# Patient Record
Sex: Female | Born: 1985
Health system: Southern US, Community
[De-identification: ages and names within clinical notes are randomized; demographics above are authoritative.]

## PROBLEM LIST (undated history)

## (undated) DIAGNOSIS — J45909 Unspecified asthma, uncomplicated: Secondary | ICD-10-CM

## (undated) HISTORY — PX: FOOT SURGERY: SHX648

## (undated) HISTORY — PX: WISDOM TOOTH EXTRACTION: SHX21

## (undated) HISTORY — DX: Unspecified asthma, uncomplicated: J45.909

---
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
## 2007-03-05 ENCOUNTER — Emergency Department (HOSPITAL_COMMUNITY): Admission: EM | Admit: 2007-03-05 | Discharge: 2007-03-06 | Payer: Self-pay | Admitting: Emergency Medicine

## 2007-03-31 ENCOUNTER — Emergency Department (HOSPITAL_COMMUNITY): Admission: EM | Admit: 2007-03-31 | Discharge: 2007-03-31 | Payer: Self-pay | Admitting: Emergency Medicine

## 2013-08-14 ENCOUNTER — Emergency Department (HOSPITAL_COMMUNITY)
Admission: EM | Admit: 2013-08-14 | Discharge: 2013-08-14 | Disposition: A | Discharge status: Home / Self Care | Payer: 59 | Source: Home / Self Care | Attending: Family Medicine | Admitting: Family Medicine

## 2013-08-14 ENCOUNTER — Encounter (HOSPITAL_COMMUNITY): Payer: Self-pay | Admitting: Emergency Medicine

## 2013-08-14 DIAGNOSIS — N390 Urinary tract infection, site not specified: Secondary | ICD-10-CM

## 2013-08-14 MED ORDER — CEPHALEXIN 500 MG PO CAPS: 500.0000 mg | ORAL_CAPSULE | Freq: Three times a day (TID) | ORAL | Status: DC

## 2013-08-14 NOTE — ED Notes (Signed)
C/o UTI  States she is having burning, frequently urine.  AZO was taken but no relief.

## 2013-08-14 NOTE — ED Provider Notes (Signed)
Paula Guzman is a 27 y.o. female who presents to Urgent Care today for UTI. Patient notes urinary frequency urgency and dysuria. This is been present since yesterday. 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 She denies any significant abdominal pain nausea vomiting or diarrhea. She has tried some anterior cruciate ligament which only helped a little. She feels well otherwise. No vaginal discharge.   History reviewed. No pertinent past medical history. History  Substance Use Topics  . Smoking status: Not on file  . Smokeless tobacco: Not on file  . Alcohol Use: Not on file   ROS as above Medications reviewed. No current facility-administered medications for this encounter.   Current Outpatient Prescriptions  Medication Sig Dispense Refill  . cephALEXin (KEFLEX) 500 MG capsule Take 1 capsule (500 mg total) by mouth 3 (three) times daily.  21 capsule  0    Exam:  BP 119/79  Pulse 76  Temp(Src) 98.3 F (36.8 C) (Oral)  Resp 17  SpO2 98%  LMP 08/05/2013 Gen: Well NAD HEENT: EOMI,  MMM Lungs: CTABL Nl WOB Heart: RRR no MRG Abd: NABS, NT, ND, no CV angle tenderness to percussion Exts: Non edematous BL  LE, warm and well perfused.   Results for orders placed during the hospital encounter of 08/14/13 (from the past 24 hour(s))  POCT PREGNANCY, URINE     Status: None   Collection Time    08/14/13  3:12 PM      Result Value Range   Preg Test, Ur NEGATIVE  NEGATIVE   No results found.  Assessment and Plan: 27 y.o. female with urinary tract infection. Plan to treat empirically with Keflex. Followup as needed. Discussed warning signs or symptoms. Please see discharge instructions. Patient expresses understanding.      Rodolph Bong, MD 08/14/13 1745

## 2015-09-03 LAB — HM PAP SMEAR

## 2016-03-07 ENCOUNTER — Encounter: Payer: Self-pay | Admitting: General Practice

## 2016-05-28 ENCOUNTER — Telehealth: Payer: Self-pay | Admitting: Emergency Medicine

## 2016-05-28 ENCOUNTER — Encounter: Payer: Self-pay | Admitting: Emergency Medicine

## 2016-05-28 NOTE — Telephone Encounter (Signed)
Pre-Visit Call completed with patient and chart updated.   Pre-Visit Info documented in Specialty Comments under SnapShot.    

## 2016-05-30 ENCOUNTER — Encounter: Payer: Self-pay | Admitting: Family Medicine

## 2016-05-30 ENCOUNTER — Ambulatory Visit (INDEPENDENT_AMBULATORY_CARE_PROVIDER_SITE_OTHER): Payer: 59 | Admitting: Family Medicine

## 2016-05-30 VITALS — BP 110/72 | HR 65 | Temp 98.8°F | Resp 16 | Ht 66.5 in | Wt 124.4 lb

## 2016-05-30 DIAGNOSIS — Z0000 Encounter for general adult medical examination without abnormal findings: Secondary | ICD-10-CM

## 2016-05-30 LAB — CBC WITH DIFFERENTIAL/PLATELET
BASOS PCT: 1 %
Basophils Absolute: 59 cells/uL (ref 0–200)
EOS ABS: 59 {cells}/uL (ref 15–500)
Eosinophils Relative: 1 %
HCT: 42.7 % (ref 35.0–45.0)
Hemoglobin: 14.2 g/dL (ref 11.7–15.5)
LYMPHS ABS: 1180 {cells}/uL (ref 850–3900)
Lymphocytes Relative: 20 %
MCH: 32.5 pg (ref 27.0–33.0)
MCHC: 33.3 g/dL (ref 32.0–36.0)
MCV: 97.7 fL (ref 80.0–100.0)
MONO ABS: 413 {cells}/uL (ref 200–950)
MONOS PCT: 7 %
MPV: 12 fL (ref 7.5–12.5)
NEUTROS ABS: 4189 {cells}/uL (ref 1500–7800)
Neutrophils Relative %: 71 %
PLATELETS: 158 10*3/uL (ref 140–400)
RBC: 4.37 MIL/uL (ref 3.80–5.10)
RDW: 12.3 % (ref 11.0–15.0)
WBC: 5.9 10*3/uL (ref 3.8–10.8)

## 2016-05-30 LAB — BASIC METABOLIC PANEL
BUN: 18 mg/dL (ref 7–25)
CALCIUM: 9.6 mg/dL (ref 8.6–10.2)
CO2: 28 mmol/L (ref 20–31)
Chloride: 102 mmol/L (ref 98–110)
Creat: 0.85 mg/dL (ref 0.50–1.10)
Glucose, Bld: 83 mg/dL (ref 65–99)
Potassium: 4.1 mmol/L (ref 3.5–5.3)
SODIUM: 138 mmol/L (ref 135–146)

## 2016-05-30 LAB — HEPATIC FUNCTION PANEL
ALT: 18 U/L (ref 6–29)
AST: 18 U/L (ref 10–30)
Albumin: 4.6 g/dL (ref 3.6–5.1)
Alkaline Phosphatase: 77 U/L (ref 33–115)
BILIRUBIN DIRECT: 0.2 mg/dL (ref <=0.2)
BILIRUBIN INDIRECT: 0.7 mg/dL (ref 0.2–1.2)
BILIRUBIN TOTAL: 0.9 mg/dL (ref 0.2–1.2)
TOTAL PROTEIN: 6.9 g/dL (ref 6.1–8.1)

## 2016-05-30 LAB — LIPID PANEL
CHOL/HDL RATIO: 2.3 ratio (ref <=5.0)
Cholesterol: 155 mg/dL (ref 125–200)
HDL: 66 mg/dL (ref >=46)
LDL CALC: 75 mg/dL (ref <130)
Triglycerides: 71 mg/dL (ref <150)
VLDL: 14 mg/dL (ref <30)

## 2016-05-30 LAB — TSH: TSH: 1.03 m[IU]/L

## 2016-05-30 NOTE — Progress Notes (Signed)
   Subjective:    Patient ID: Paula Guzman, female    DOB: 1986-02-07, 30 y.o.   MRN: [id number]  HPI New to establish.  Previous MD- no PCP  GYN- [facility]   Review of Systems Patient reports no vision/ hearing changes, adenopathy,fever, weight change,  persistant/recurrent hoarseness , swallowing issues, chest pain, palpitations, edema, persistant/recurrent cough, hemoptysis, dyspnea (rest/exertional/paroxysmal nocturnal), gastrointestinal bleeding (melena, rectal bleeding), abdominal pain, significant heartburn, bowel changes, GU symptoms (dysuria, hematuria, incontinence), Gyn symptoms (abnormal  bleeding, pain),  syncope, focal weakness, memory loss, numbness & tingling, skin/hair/nail changes, abnormal bruising or bleeding, anxiety, or depression.     Objective:   Physical Exam General Appearance:    Alert, cooperative, no distress, appears stated age  Head:    Normocephalic, without obvious abnormality, atraumatic  Eyes:    PERRL, conjunctiva/corneas clear, EOM's intact, fundi    benign, both eyes  Ears:    Normal TM's and external ear canals, both ears  Nose:   Nares normal, septum midline, mucosa normal, no drainage    or sinus tenderness  Throat:   Lips, mucosa, and tongue normal; teeth and gums normal  Neck:   Supple, symmetrical, trachea midline, no adenopathy;    Thyroid: no enlargement/tenderness/nodules  Back:     Symmetric, no curvature, ROM normal, no CVA tenderness  Lungs:     Clear to auscultation bilaterally, respirations unlabored  Chest Wall:    No tenderness or deformity   Heart:    Regular rate and rhythm, S1 and S2 normal, no murmur, rub   or gallop  Breast Exam:    Deferred to GYN  Abdomen:     Soft, non-tender, bowel sounds active all four quadrants,    no masses, no organomegaly  Genitalia:    Deferred to GYN  Rectal:    Extremities:   Extremities normal, atraumatic, no cyanosis or edema  Pulses:   2+ and symmetric all extremities  Skin:   Skin color,  texture, turgor normal, no rashes or lesions  Lymph nodes:   Cervical, supraclavicular, and axillary nodes normal  Neurologic:   CNII-XII intact, normal strength, sensation and reflexes    throughout          Assessment & Plan:  CPE- pt's PE WNL.  UTD on Tdap until next year.  UTD on pap.  Check labs.  Anticipatory guidance provided.

## 2016-05-30 NOTE — Progress Notes (Signed)
Pre visit review using our clinic review tool, if applicable. No additional management support is needed unless otherwise documented below in the visit note. 

## 2016-05-30 NOTE — Patient Instructions (Signed)
Follow up in 1 year or as needed We'll notify you of your lab results and make any changes if needed Keep up the good work on healthy diet and regular exercise- you look great! Call with any questions or concerns Welcome!  We're glad to have you!!! 

## 2016-05-31 LAB — VITAMIN D 25 HYDROXY (VIT D DEFICIENCY, FRACTURES): Vit D, 25-Hydroxy: 45 ng/mL (ref 30–100)

## 2016-07-14 ENCOUNTER — Telehealth: Payer: 59 | Admitting: Family

## 2016-07-14 DIAGNOSIS — J208 Acute bronchitis due to other specified organisms: Secondary | ICD-10-CM

## 2016-07-14 MED ORDER — BENZONATATE 100 MG PO CAPS: 100.0000 mg | ORAL_CAPSULE | Freq: Two times a day (BID) | ORAL | 0 refills | Status: DC | PRN

## 2016-07-14 NOTE — Progress Notes (Signed)

## 2016-08-06 DIAGNOSIS — R87612 Low grade squamous intraepithelial lesion on cytologic smear of cervix (LGSIL): Secondary | ICD-10-CM | POA: Insufficient documentation

## 2016-08-12 DIAGNOSIS — Z681 Body mass index (BMI) 19 or less, adult: Secondary | ICD-10-CM | POA: Diagnosis not present

## 2016-08-12 DIAGNOSIS — Z124 Encounter for screening for malignant neoplasm of cervix: Secondary | ICD-10-CM | POA: Diagnosis not present

## 2016-08-12 DIAGNOSIS — Z01419 Encounter for gynecological examination (general) (routine) without abnormal findings: Secondary | ICD-10-CM | POA: Diagnosis not present

## 2016-08-12 DIAGNOSIS — Z202 Contact with and (suspected) exposure to infections with a predominantly sexual mode of transmission: Secondary | ICD-10-CM | POA: Diagnosis not present

## 2016-08-12 LAB — HM PAP SMEAR

## 2016-08-13 ENCOUNTER — Encounter: Payer: Self-pay | Admitting: General Practice

## 2016-10-21 DIAGNOSIS — H5213 Myopia, bilateral: Secondary | ICD-10-CM | POA: Diagnosis not present

## 2016-12-03 ENCOUNTER — Ambulatory Visit (INDEPENDENT_AMBULATORY_CARE_PROVIDER_SITE_OTHER): Payer: 59 | Admitting: Family Medicine

## 2016-12-03 ENCOUNTER — Encounter: Payer: Self-pay | Admitting: Family Medicine

## 2016-12-03 VITALS — BP 112/78 | HR 74 | Temp 99.0°F | Resp 16 | Ht 67.0 in | Wt 124.5 lb

## 2016-12-03 DIAGNOSIS — M899 Disorder of bone, unspecified: Secondary | ICD-10-CM

## 2016-12-03 DIAGNOSIS — M898X8 Other specified disorders of bone, other site: Secondary | ICD-10-CM

## 2016-12-03 NOTE — Progress Notes (Signed)
   Subjective:    Patient ID: Paula Guzman, female    DOB: [birth year], 31 y.o.   MRN: [id number]  HPI Lump on head- pt reports lump on head for 1-2 years.  Area is not painful, not changing in size.  Pt reports size of marble.  No redness, soreness, drainage from area.   Review of Systems For ROS see HPI     Objective:   Physical Exam  Constitutional: She is oriented to person, place, and time. She appears well-developed and well-nourished. No distress.  HENT:  Head: Atraumatic.  ~1.5 cm firm, round apparently bony protuberance on R parietal area  Neurological: She is alert and oriented to person, place, and time.  Skin: Skin is warm and dry.  Psychiatric: She has a normal mood and affect. Her behavior is normal. Thought content normal.  Vitals reviewed.         Assessment & Plan:  Lump- new.  Suspect osteoma but will get imaging to assess.  Pt expressed understanding and is in agreement w/ plan. r

## 2016-12-03 NOTE — Progress Notes (Signed)
Pre visit review using our clinic review tool, if applicable. No additional management support is needed unless otherwise documented below in the visit note. 

## 2016-12-03 NOTE — Patient Instructions (Signed)
Follow up as needed We'll call you with your CT appt and then the results Try and relax!  This is most likely (and hopefully!) nothing Call with any questions or concerns Hang in there!!!

## 2016-12-05 ENCOUNTER — Ambulatory Visit (HOSPITAL_COMMUNITY)
Admission: RE | Admit: 2016-12-05 | Discharge: 2016-12-05 | Disposition: A | Discharge status: Home / Self Care | Payer: 59 | Source: Ambulatory Visit | Attending: Family Medicine | Admitting: Family Medicine

## 2016-12-05 DIAGNOSIS — R220 Localized swelling, mass and lump, head: Secondary | ICD-10-CM | POA: Diagnosis not present

## 2016-12-05 DIAGNOSIS — M899 Disorder of bone, unspecified: Secondary | ICD-10-CM | POA: Diagnosis not present

## 2016-12-05 DIAGNOSIS — M898X8 Other specified disorders of bone, other site: Secondary | ICD-10-CM

## 2017-04-30 ENCOUNTER — Telehealth: Payer: Self-pay | Admitting: Family Medicine

## 2017-04-30 ENCOUNTER — Ambulatory Visit (INDEPENDENT_AMBULATORY_CARE_PROVIDER_SITE_OTHER): Payer: 59 | Admitting: Emergency Medicine

## 2017-04-30 DIAGNOSIS — Z23 Encounter for immunization: Secondary | ICD-10-CM

## 2017-04-30 NOTE — Telephone Encounter (Signed)
Pt states that she has school forms that need to be filled out and that a TDap is needed as well. Is it ok to schedule injection for her? Please advise.

## 2017-04-30 NOTE — Telephone Encounter (Signed)
We can schedule pt for a nurse visit. She is also due for a physical.

## 2017-04-30 NOTE — Telephone Encounter (Signed)
Pt has been schedule for nurse visit, pt last cpe was 05/2016 and is scheduled for 05/2017 already.

## 2017-04-30 NOTE — Progress Notes (Signed)
Patient presents today for nurse visit to update her Tdap. Tdap vaccine was administered in left deltoid. She tolerated vaccine well.

## 2017-05-08 ENCOUNTER — Telehealth: Payer: Self-pay | Admitting: Family Medicine

## 2017-05-08 NOTE — Telephone Encounter (Signed)
[facility] for appt. Orders were placed

## 2017-05-08 NOTE — Telephone Encounter (Signed)
Ok for Hep B surface antibody to show immunity

## 2017-05-08 NOTE — Telephone Encounter (Signed)
[facility] for titer? Pt had her last CPE 05/2016

## 2017-05-08 NOTE — Telephone Encounter (Signed)
Pt asking if she could come in to have a Hep B titer done on Monday or Tuesday, pt states that she is needing this  for school

## 2017-05-12 ENCOUNTER — Encounter: Payer: Self-pay | Admitting: Family Medicine

## 2017-05-13 ENCOUNTER — Ambulatory Visit (INDEPENDENT_AMBULATORY_CARE_PROVIDER_SITE_OTHER): Payer: 59 | Admitting: Emergency Medicine

## 2017-05-13 DIAGNOSIS — Z23 Encounter for immunization: Secondary | ICD-10-CM | POA: Diagnosis not present

## 2017-05-13 NOTE — Progress Notes (Addendum)
Patient presents today to start Hepatitis B series. Per her last hepatitis B surface antibody testing shows she is not immune to Hep B. Results scanned into chart. Hepatitis B injection administered in Left deltoid. Patient tolerated injection well.  Above order was mine.  Annye Asa, MD

## 2017-05-13 NOTE — Addendum Note (Signed)
Addended by: Midge Minium on: 05/13/2017 04:11 PM   Modules accepted: Level of Service

## 2017-05-18 ENCOUNTER — Other Ambulatory Visit: Payer: Self-pay | Admitting: Obstetrics & Gynecology

## 2017-05-18 DIAGNOSIS — J4521 Mild intermittent asthma with (acute) exacerbation: Secondary | ICD-10-CM

## 2017-05-18 DIAGNOSIS — J45909 Unspecified asthma, uncomplicated: Secondary | ICD-10-CM | POA: Insufficient documentation

## 2017-05-18 MED ORDER — MONTELUKAST SODIUM 10 MG PO TABS
10.0000 mg | ORAL_TABLET | Freq: Every day | ORAL | 5 refills | Status: DC
Start: 2017-05-18 — End: 2018-01-11

## 2017-05-18 MED ORDER — PREDNISONE 50 MG PO TABS: 50.0000 mg | ORAL_TABLET | Freq: Every day | ORAL | 5 refills | Status: AC

## 2017-05-18 MED FILL — predniSONE 50 MG TABS: 50 | 5 days supply | Qty: 5 | Fill #0

## 2017-05-18 MED FILL — MONTELUKAST SOD 10 MG TAB: 10 | 30 days supply | Qty: 30 | Fill #0

## 2017-06-01 ENCOUNTER — Encounter: Payer: Self-pay | Admitting: Family Medicine

## 2017-06-01 ENCOUNTER — Ambulatory Visit (INDEPENDENT_AMBULATORY_CARE_PROVIDER_SITE_OTHER): Payer: 59 | Admitting: Family Medicine

## 2017-06-01 VITALS — BP 110/68 | HR 61 | Temp 98.3°F | Resp 16 | Ht 67.0 in | Wt 132.4 lb

## 2017-06-01 DIAGNOSIS — Z0000 Encounter for general adult medical examination without abnormal findings: Secondary | ICD-10-CM | POA: Diagnosis not present

## 2017-06-01 DIAGNOSIS — J4520 Mild intermittent asthma, uncomplicated: Secondary | ICD-10-CM | POA: Diagnosis not present

## 2017-06-01 LAB — CBC WITH DIFFERENTIAL/PLATELET
BASOS ABS: 0 10*3/uL (ref 0.0–0.1)
BASOS PCT: 0.6 % (ref 0.0–3.0)
EOS ABS: 0 10*3/uL (ref 0.0–0.7)
Eosinophils Relative: 0.3 % (ref 0.0–5.0)
HEMATOCRIT: 40.9 % (ref 36.0–46.0)
HEMOGLOBIN: 14 g/dL (ref 12.0–15.0)
LYMPHS PCT: 17.4 % (ref 12.0–46.0)
Lymphs Abs: 1.3 10*3/uL (ref 0.7–4.0)
MCHC: 34.2 g/dL (ref 30.0–36.0)
MCV: 97.4 fl (ref 78.0–100.0)
MONO ABS: 0.4 10*3/uL (ref 0.1–1.0)
Monocytes Relative: 5.4 % (ref 3.0–12.0)
Neutro Abs: 5.7 10*3/uL (ref 1.4–7.7)
Neutrophils Relative %: 76.3 % (ref 43.0–77.0)
Platelets: 151 10*3/uL (ref 150.0–400.0)
RBC: 4.2 Mil/uL (ref 3.87–5.11)
RDW: 12.3 % (ref 11.5–15.5)
WBC: 7.5 10*3/uL (ref 4.0–10.5)

## 2017-06-01 LAB — HEPATIC FUNCTION PANEL
ALBUMIN: 4.2 g/dL (ref 3.5–5.2)
ALT: 20 U/L (ref 0–35)
AST: 16 U/L (ref 0–37)
Alkaline Phosphatase: 67 U/L (ref 39–117)
BILIRUBIN DIRECT: 0.2 mg/dL (ref 0.0–0.3)
TOTAL PROTEIN: 6.8 g/dL (ref 6.0–8.3)
Total Bilirubin: 0.8 mg/dL (ref 0.2–1.2)

## 2017-06-01 LAB — TSH: TSH: 1.25 u[IU]/mL (ref 0.35–4.50)

## 2017-06-01 LAB — LIPID PANEL
CHOLESTEROL: 124 mg/dL (ref 0–200)
HDL: 53.9 mg/dL (ref >39.00)
LDL CALC: 60 mg/dL (ref 0–99)
NonHDL: 70.09
Total CHOL/HDL Ratio: 2
Triglycerides: 51 mg/dL (ref 0.0–149.0)
VLDL: 10.2 mg/dL (ref 0.0–40.0)

## 2017-06-01 LAB — VITAMIN D 25 HYDROXY (VIT D DEFICIENCY, FRACTURES): VITD: 36.76 ng/mL (ref 30.00–100.00)

## 2017-06-01 LAB — BASIC METABOLIC PANEL
BUN: 9 mg/dL (ref 6–23)
CALCIUM: 9.3 mg/dL (ref 8.4–10.5)
CO2: 29 meq/L (ref 19–32)
CREATININE: 0.57 mg/dL (ref 0.40–1.20)
Chloride: 106 mEq/L (ref 96–112)
GFR: 133.3 mL/min (ref >60.00)
GLUCOSE: 98 mg/dL (ref 70–99)
Potassium: 4.1 mEq/L (ref 3.5–5.1)
SODIUM: 141 meq/L (ref 135–145)

## 2017-06-01 MED ORDER — CETIRIZINE HCL 10 MG PO TABS: 10.0000 mg | ORAL_TABLET | Freq: Every day | ORAL | 11 refills | Status: AC

## 2017-06-01 MED ORDER — ALBUTEROL SULFATE HFA 108 (90 BASE) MCG/ACT IN AERS: 2.0000 | INHALATION_SPRAY | Freq: Four times a day (QID) | RESPIRATORY_TRACT | 2 refills | Status: DC | PRN

## 2017-06-01 MED ORDER — FLUTICASONE PROPIONATE 50 MCG/ACT NA SUSP: 2.0000 | Freq: Every day | NASAL | 6 refills | Status: DC

## 2017-06-01 MED FILL — FLUTICASONE PROP 50 MCG SPR: 50 | 30 days supply | Qty: 16 | Fill #0

## 2017-06-01 MED FILL — VENTOLIN HFA 90 MCG INHALER: 108 (90 BAS | 30 days supply | Qty: 18 | Fill #0

## 2017-06-01 NOTE — Patient Instructions (Signed)
Follow up in 1 year or as needed We'll notify you of your lab results and make any changes if needed Start the Zyrtec daily in addition to your Singulair Start the Flonase daily- 2 sprays each nostril Use the Albuterol inhaler as needed for cough/shortness of breath Call with any questions or concerns [facility]!

## 2017-06-01 NOTE — Progress Notes (Signed)
Pre visit review using our clinic review tool, if applicable. No additional management support is needed unless otherwise documented below in the visit note. 

## 2017-06-01 NOTE — Progress Notes (Signed)
   Subjective:    Patient ID: Paula Guzman, female    DOB: 10-09-1986, 31 y.o.   MRN: [id number]  HPI CPE- UTD on pap, Tdap.  Pt reports that allergies have been severe recently and causing asthma to flare.  Will get chest tightness and cough when working out.   Review of Systems Patient reports no vision/ hearing changes, adenopathy,fever, weight change,  persistant/recurrent hoarseness , swallowing issues, chest pain, palpitations, edema, persistant/recurrent cough, hemoptysis, dyspnea (rest/exertional/paroxysmal nocturnal), gastrointestinal bleeding (melena, rectal bleeding), abdominal pain, significant heartburn, bowel changes, GU symptoms (dysuria, hematuria, incontinence), Gyn symptoms (abnormal  bleeding, pain),  syncope, focal weakness, memory loss, numbness & tingling, skin/hair/nail changes, abnormal bruising or bleeding, anxiety, or depression.     Objective:   Physical Exam General Appearance:    Alert, cooperative, no distress, appears stated age  Head:    Normocephalic, without obvious abnormality, atraumatic  Eyes:    PERRL, conjunctiva/corneas clear, EOM's intact, fundi    benign, both eyes  Ears:    Normal TM's and external ear canals, both ears  Nose:   Nares normal, septum midline, mucosa normal, no drainage    or sinus tenderness  Throat:   Lips, mucosa, and tongue normal; teeth and gums normal  Neck:   Supple, symmetrical, trachea midline, no adenopathy;    Thyroid: no enlargement/tenderness/nodules  Back:     Symmetric, no curvature, ROM normal, no CVA tenderness  Lungs:     Clear to auscultation bilaterally, respirations unlabored  Chest Wall:    No tenderness or deformity   Heart:    Regular rate and rhythm, S1 and S2 normal, no murmur, rub   or gallop  Breast Exam:    Deferred to GYN  Abdomen:     Soft, non-tender, bowel sounds active all four quadrants,    no masses, no organomegaly  Genitalia:    Deferred to GYN  Rectal:    Extremities:   Extremities  normal, atraumatic, no cyanosis or edema  Pulses:   2+ and symmetric all extremities  Skin:   Skin color, texture, turgor normal, no rashes or lesions  Lymph nodes:   Cervical, supraclavicular, and axillary nodes normal  Neurologic:   CNII-XII intact, normal strength, sensation and reflexes    throughout          Assessment & Plan:

## 2017-06-01 NOTE — Assessment & Plan Note (Signed)
Deteriorated due to seasonal allergies.  Start daily Zyrtec in addition to Singulair.  Add Flonase.  Albuterol PRN.  Pt expressed understanding and is in agreement w/ plan.

## 2017-06-01 NOTE — Assessment & Plan Note (Signed)
Pt's PE WNL.  UTD on GYN, Tdap.  Check labs.  Anticipatory guidance provided.  

## 2017-06-02 ENCOUNTER — Encounter: Payer: Self-pay | Admitting: General Practice

## 2017-06-16 MED FILL — MONTELUKAST SOD 10 MG TAB: 10 | 30 days supply | Qty: 30 | Fill #1

## 2017-07-12 IMAGING — CT CT HEAD W/O CM
3 of 4 series · 14 of 47 positions shown, 16 images · non-contrast
Comparison: None.

CLINICAL DATA: Right parietal skull mass

EXAM:
CT HEAD WITHOUT CONTRAST
TECHNIQUE: Contiguous axial images were obtained from the base of the skull
through the vertex without intravenous contrast.

[Series 2: head w/o · axial · non-contrast · 0.45mm/px · z∈[-88,+32]mm · 8 of 29 slices shown, 10 images]
[im 3/29  brain]
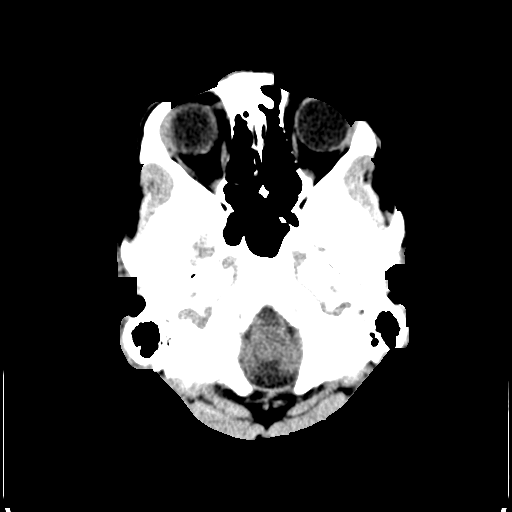
[im 3/29  bone]
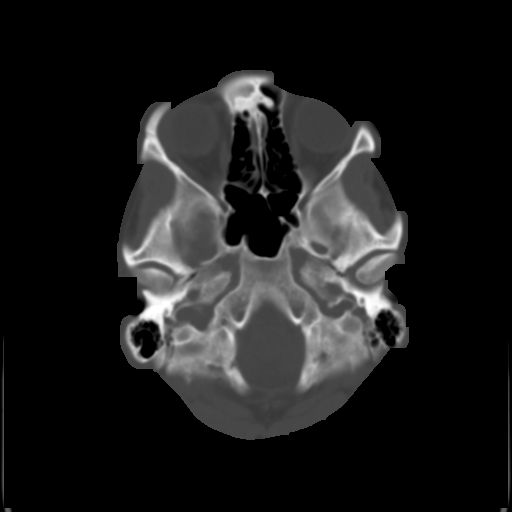
[im 7/29  brain]
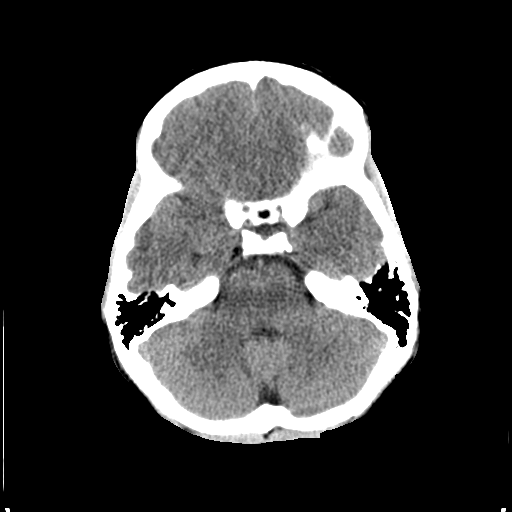
[im 11/29  brain]
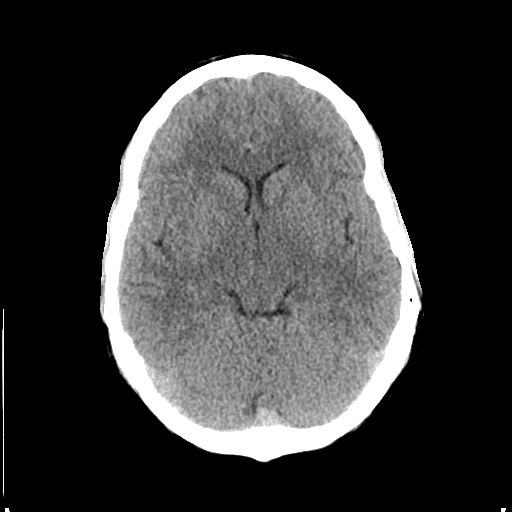
[im 13/29  brain]
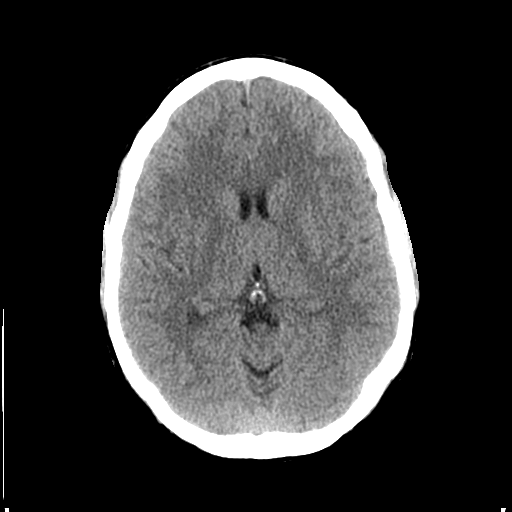
[im 17/29  brain]
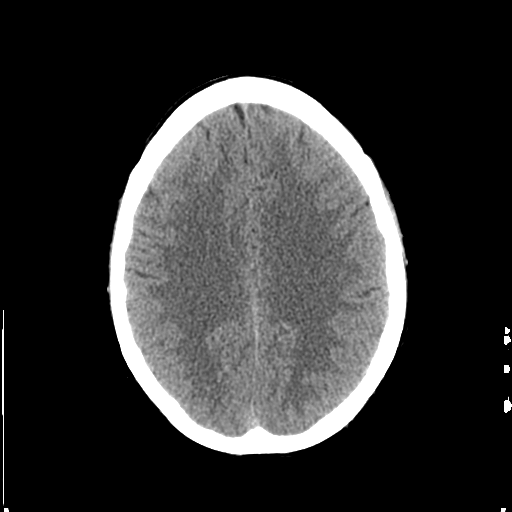
[im 17/29  bone]
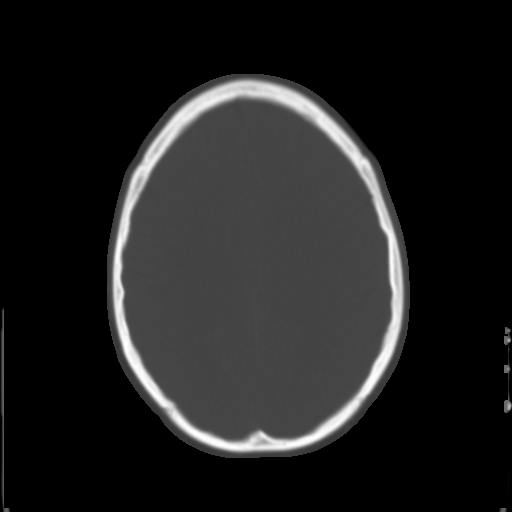
[im 19/29  brain]
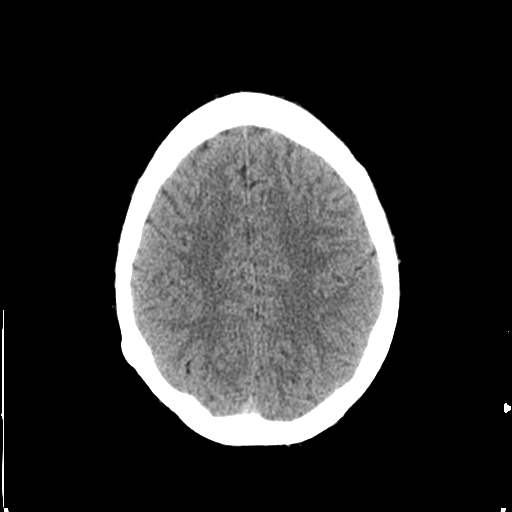
[im 23/29  brain]
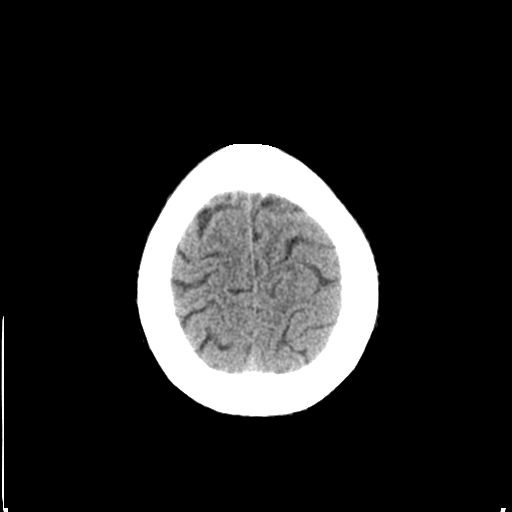
[im 27/29  brain]
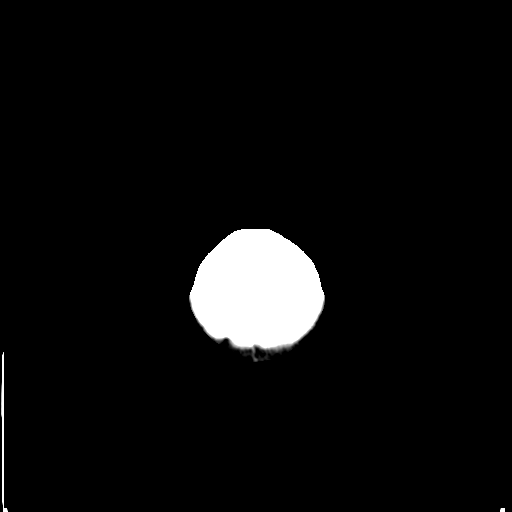

[Series 5: coronal · coronal · 0.28mm/px · 3 of 77 slices shown]
[im 26/77  brain]
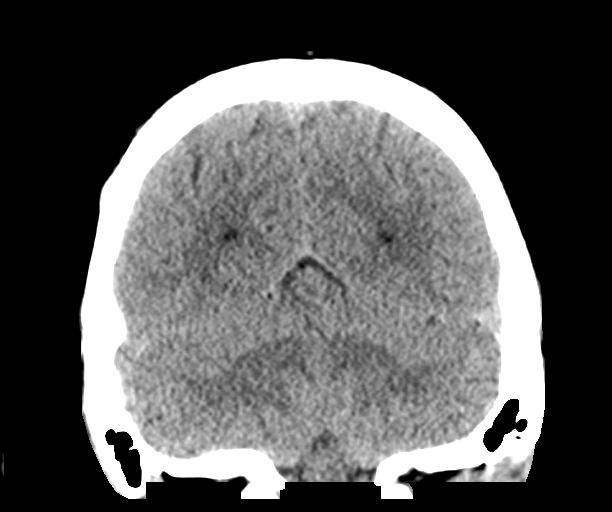
[im 34/77  brain]
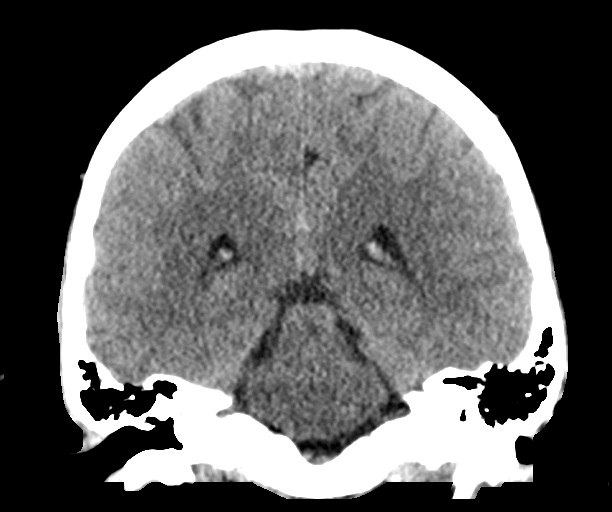
[im 43/77  brain]
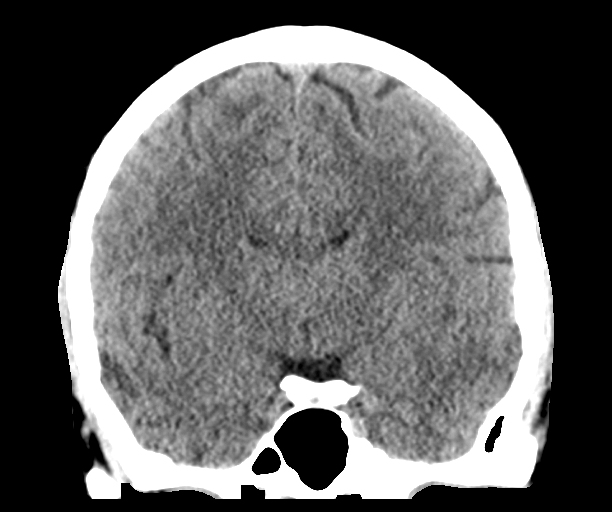

[Series 6: sagittal · sagittal · 0.28mm/px · 3 of 59 slices shown]
[im 20/59  brain]
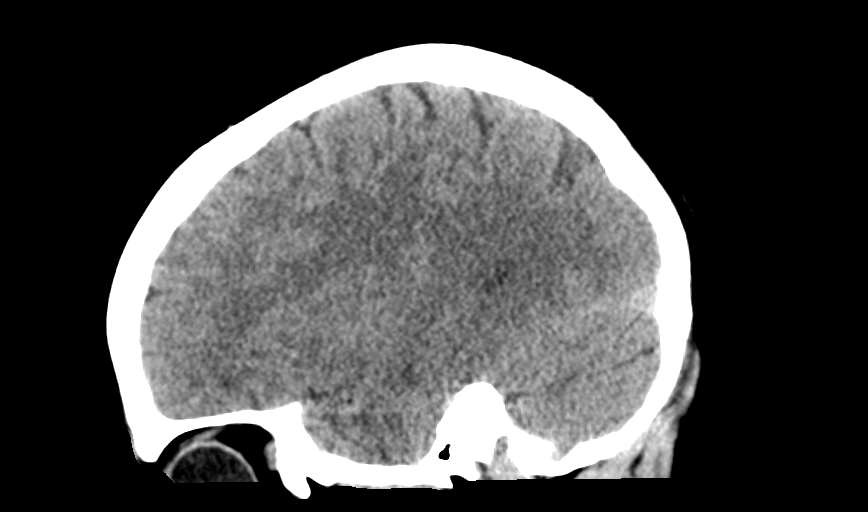
[im 30/59  brain]
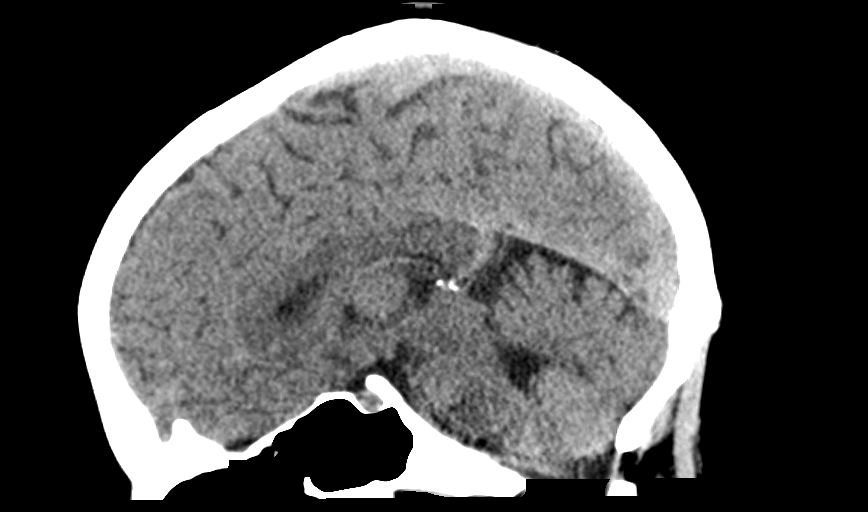
[im 39/59  brain]
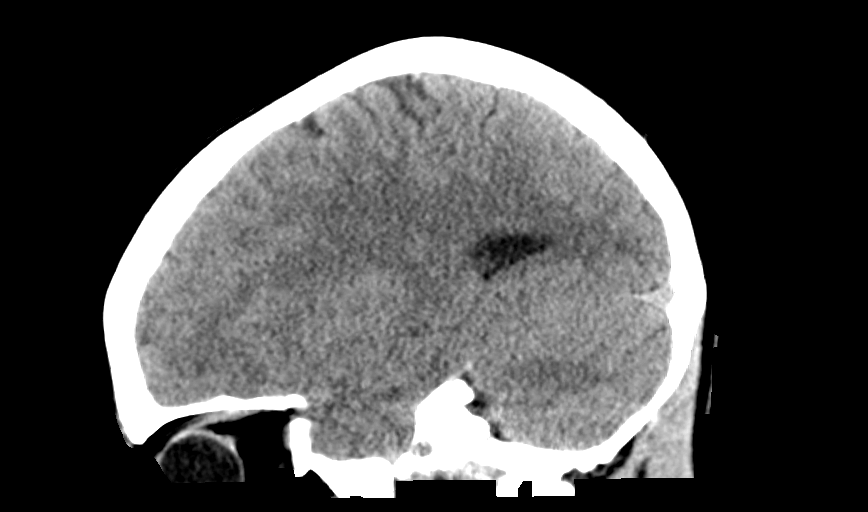

[14 of 47 positions shown; findings below may reference images not displayed]

FINDINGS: Brain: No evidence of acute infarction, hemorrhage, hydrocephalus,
extra-axial collection or mass lesion/mass effect.

Vascular: No hyperdense vessel or unexpected calcification.

Skull: Small exostosis right parietal bone projecting outwardly
corresponding to the palpable abnormality. This has a benign
appearance and measures approximately 3 x 15 mm. Overlying soft
tissues normal.

Sinuses/Orbits: Negative

Other: None
IMPRESSION: Normal ct head

Benign thickening of the right parietal bone most likely exostosis
corresponding to the palpable abnormality.

## 2017-07-13 ENCOUNTER — Ambulatory Visit (INDEPENDENT_AMBULATORY_CARE_PROVIDER_SITE_OTHER): Payer: 59 | Admitting: Emergency Medicine

## 2017-07-13 DIAGNOSIS — Z23 Encounter for immunization: Secondary | ICD-10-CM | POA: Diagnosis not present

## 2017-07-13 MED FILL — MONTELUKAST SOD 10 MG TAB: 10 | 30 days supply | Qty: 30 | Fill #2

## 2017-07-13 MED FILL — FLUTICASONE PROP 50 MCG SPR: 50 | 30 days supply | Qty: 16 | Fill #1

## 2017-07-13 NOTE — Progress Notes (Signed)
Patient presents today for her 2nd Hepatitis B injection. Hepatitis B injection was given in Left deltoid. Orders given by PCP Dr Annye Asa.

## 2017-08-14 ENCOUNTER — Encounter: Payer: Self-pay | Admitting: Physician Assistant

## 2017-08-14 ENCOUNTER — Ambulatory Visit (INDEPENDENT_AMBULATORY_CARE_PROVIDER_SITE_OTHER): Payer: 59 | Admitting: Physician Assistant

## 2017-08-14 VITALS — BP 108/70 | HR 53 | Temp 98.3°F | Resp 14 | Ht 67.0 in | Wt 136.0 lb

## 2017-08-14 DIAGNOSIS — R05 Cough: Secondary | ICD-10-CM | POA: Diagnosis not present

## 2017-08-14 DIAGNOSIS — R058 Other specified cough: Secondary | ICD-10-CM

## 2017-08-14 MED ORDER — IPRATROPIUM-ALBUTEROL 0.5-2.5 (3) MG/3ML IN SOLN
3.0000 mL | Freq: Once | RESPIRATORY_TRACT | Status: AC
  Administered 2017-08-14: 3 mL via RESPIRATORY_TRACT

## 2017-08-14 MED ORDER — BENZONATATE 100 MG PO CAPS: 100.0000 mg | ORAL_CAPSULE | Freq: Three times a day (TID) | ORAL | 0 refills | Status: DC | PRN

## 2017-08-14 MED FILL — BENZONATATE 100 MG CAPS: 100 | 10 days supply | Qty: 30 | Fill #0

## 2017-08-14 MED FILL — predniSONE 50 MG TABS: 50 | 5 days supply | Qty: 5 | Fill #1

## 2017-08-14 MED FILL — MONTELUKAST SOD 10 MG TAB: 10 | 30 days supply | Qty: 30 | Fill #3

## 2017-08-14 NOTE — Addendum Note (Signed)
Addended by: Leonidas Romberg on: 08/14/2017 11:06 AM   Modules accepted: Orders

## 2017-08-14 NOTE — Progress Notes (Signed)
Patient with history of asthma and seasonal allergies presents to clinic today c/o 2 weeks of persistent cough, mainly non-productive but will occasionally have a slight amount of yellow sputum. States symptoms started while traveling in [place] when she was exposed to a lot of cigarette smoke. Endorses mild scratchy throat due to coughing. Denies fever, wheezing, sinus pain. Has albuterol inhaler that she has used a few times to help with cough.   Past Medical History:  Diagnosis Date  . Asthma    allergy related as a child    Current Outpatient Prescriptions on File Prior to Visit  Medication Sig Dispense Refill  . albuterol (PROVENTIL HFA;VENTOLIN HFA) 108 (90 Base) MCG/ACT inhaler Inhale 2 puffs into the lungs every 6 (six) hours as needed for wheezing or shortness of breath. 1 Inhaler 2  . cetirizine (ZYRTEC) 10 MG tablet Take 1 tablet (10 mg total) by mouth daily. 30 tablet 11  . fluticasone (FLONASE) 50 MCG/ACT nasal spray Place 2 sprays into both nostrils daily. 16 g 6  . levonorgestrel (MIRENA) 20 MCG/24HR IUD 1 each by Intrauterine route once.    . montelukast (SINGULAIR) 10 MG tablet Take 1 tablet (10 mg total) by mouth at bedtime. 30 tablet 5  . Multiple Vitamins-Minerals (MULTIVITAMIN WITH MINERALS) tablet Take 1 tablet by mouth daily.    . Saccharomyces boulardii (PROBIOTIC) 250 MG CAPS Take 1 capsule by mouth daily.     No current facility-administered medications on file prior to visit.     No Known Allergies  Family History  Problem Relation Age of Onset  . Atrial fibrillation Father   . Cancer Paternal Grandmother        Lyphoma and Breast Cancer    Social History   Social History  . Marital status: Single    Spouse name: N/A  . Number of children: N/A  . Years of education: N/A   Social History Main Topics  . Smoking status: Never Smoker  . Smokeless tobacco: Never Used  . Alcohol use Yes     Comment: social  . Drug use: No  . Sexual activity: Yes    Other Topics Concern  . None   Social History Narrative  . None   Review of Systems - See HPI.  All other ROS are negative.  BP 108/70   Pulse (!) 53   Temp 98.3 F (36.8 C) (Oral)   Resp 14   Ht 5\' 7"  (1.702 m)   Wt 136 lb (61.7 kg)   SpO2 99%   BMI 21.30 kg/m   Physical Exam  Constitutional: She is well-developed, well-nourished, and in no distress.  HENT:  Head: Normocephalic and atraumatic.  Right Ear: External ear normal.  Left Ear: External ear normal.  Nose: Nose normal.  Mouth/Throat: Oropharynx is clear and moist. No oropharyngeal exudate.  TM within normal limits bilaterally. Mild erythema or posterior oropharynx  Eyes: Conjunctivae are normal.  Neck: Neck supple.  Pulmonary/Chest: Effort normal and breath sounds normal. No respiratory distress. She has no wheezes. She has no rales. She exhibits no tenderness.  Vitals reviewed.   Recent Results (from the past 2160 hour(s))  Lipid panel     Status: None   Collection Time: 06/01/17  2:57 PM  Result Value Ref Range   Cholesterol 124 0 - 200 mg/dL    Comment: ATP III Classification       Desirable:  < 200 mg/dL  Borderline High:  200 - 239 mg/dL          High:  > = 240 mg/dL   Triglycerides 51.0 0.0 - 149.0 mg/dL    Comment: Normal:  <150 mg/dLBorderline High:  150 - 199 mg/dL   HDL 53.90 >39.00 mg/dL   VLDL 10.2 0.0 - 40.0 mg/dL   LDL Cholesterol 60 0 - 99 mg/dL   Total CHOL/HDL Ratio 2     Comment:                Men          Women1/2 Average Risk     3.4          3.3Average Risk          5.0          4.42X Average Risk          9.6          7.13X Average Risk          15.0          11.0                       NonHDL 70.09     Comment: NOTE:  Non-HDL goal should be 30 mg/dL higher than patient's LDL goal (i.e. LDL goal of < 70 mg/dL, would have non-HDL goal of < 100 mg/dL)  Basic metabolic panel     Status: None   Collection Time: 06/01/17  2:57 PM  Result Value Ref Range   Sodium 141 135 -  145 mEq/L   Potassium 4.1 3.5 - 5.1 mEq/L   Chloride 106 96 - 112 mEq/L   CO2 29 19 - 32 mEq/L   Glucose, Bld 98 70 - 99 mg/dL   BUN 9 6 - 23 mg/dL   Creatinine, Ser 0.57 0.40 - 1.20 mg/dL   Calcium 9.3 8.4 - 10.5 mg/dL   GFR 133.30 >60.00 mL/min  TSH     Status: None   Collection Time: 06/01/17  2:57 PM  Result Value Ref Range   TSH 1.25 0.35 - 4.50 uIU/mL  Hepatic function panel     Status: None   Collection Time: 06/01/17  2:57 PM  Result Value Ref Range   Total Bilirubin 0.8 0.2 - 1.2 mg/dL   Bilirubin, Direct 0.2 0.0 - 0.3 mg/dL   Alkaline Phosphatase 67 39 - 117 U/L   AST 16 0 - 37 U/L   ALT 20 0 - 35 U/L   Total Protein 6.8 6.0 - 8.3 g/dL   Albumin 4.2 3.5 - 5.2 g/dL  VITAMIN D 25 Hydroxy (Vit-D Deficiency, Fractures)     Status: None   Collection Time: 06/01/17  2:57 PM  Result Value Ref Range   VITD 36.76 30.00 - 100.00 ng/mL  CBC with Differential/Platelet     Status: None   Collection Time: 06/01/17  2:57 PM  Result Value Ref Range   WBC 7.5 4.0 - 10.5 K/uL   RBC 4.20 3.87 - 5.11 Mil/uL   Hemoglobin 14.0 12.0 - 15.0 g/dL   HCT 40.9 36.0 - 46.0 %   MCV 97.4 78.0 - 100.0 fl   MCHC 34.2 30.0 - 36.0 g/dL   RDW 12.3 11.5 - 15.5 %   Platelets 151.0 150.0 - 400.0 K/uL   Neutrophils Relative % 76.3 43.0 - 77.0 %   Lymphocytes Relative 17.4 12.0 - 46.0 %   Monocytes Relative 5.4 3.0 - 12.0 %  Eosinophils Relative 0.3 0.0 - 5.0 %   Basophils Relative 0.6 0.0 - 3.0 %   Neutro Abs 5.7 1.4 - 7.7 K/uL   Lymphs Abs 1.3 0.7 - 4.0 K/uL   Monocytes Absolute 0.4 0.1 - 1.0 K/uL   Eosinophils Absolute 0.0 0.0 - 0.7 K/uL   Basophils Absolute 0.0 0.0 - 0.1 K/uL   Assessment/Plan: Post-viral cough syndrome Along with bronchospasm 2/2 allergies. Increase fluids. Start saline nasal rinse. Albuterol and allergy medications as directed. Start Tessalon. Patient to start her refill of prednisone burst.     Leeanne Rio, PA-C

## 2017-08-14 NOTE — Patient Instructions (Signed)
Please stay well-hydrated and get plenty of rest.  Start saline nasal rinse to help with any lingering post-nasal drip. Albuterol as directed when needed. I have sent in a prescription cough medication for you to take as directed.  Can fill your steroid and take as directed. Follow-up if not resolving.

## 2017-08-14 NOTE — Assessment & Plan Note (Signed)
Along with bronchospasm 2/2 allergies. Increase fluids. Start saline nasal rinse. Albuterol and allergy medications as directed. Start Tessalon. Patient to start her refill of prednisone burst.

## 2017-08-14 NOTE — Progress Notes (Signed)
Pre visit review using our clinic review tool, if applicable. No additional management support is needed unless otherwise documented below in the visit note. 

## 2017-09-23 ENCOUNTER — Ambulatory Visit (INDEPENDENT_AMBULATORY_CARE_PROVIDER_SITE_OTHER): Payer: 59 | Admitting: Emergency Medicine

## 2017-09-23 DIAGNOSIS — Z23 Encounter for immunization: Secondary | ICD-10-CM | POA: Diagnosis not present

## 2017-09-23 NOTE — Progress Notes (Signed)
Patient presents today for her 3rd Hepatitis B vaccine. Patient tolerated injection well.

## 2017-10-04 ENCOUNTER — Ambulatory Visit (INDEPENDENT_AMBULATORY_CARE_PROVIDER_SITE_OTHER): Payer: 59

## 2017-10-04 ENCOUNTER — Ambulatory Visit (HOSPITAL_COMMUNITY)
Admission: EM | Admit: 2017-10-04 | Discharge: 2017-10-04 | Disposition: A | Discharge status: Home / Self Care | Payer: 59 | Attending: Family Medicine | Admitting: Family Medicine

## 2017-10-04 ENCOUNTER — Encounter (HOSPITAL_COMMUNITY): Payer: Self-pay | Admitting: Emergency Medicine

## 2017-10-04 DIAGNOSIS — R05 Cough: Secondary | ICD-10-CM

## 2017-10-04 DIAGNOSIS — J40 Bronchitis, not specified as acute or chronic: Secondary | ICD-10-CM | POA: Diagnosis not present

## 2017-10-04 MED ORDER — BENZONATATE 100 MG PO CAPS: 100.0000 mg | ORAL_CAPSULE | Freq: Three times a day (TID) | ORAL | 0 refills | Status: DC | PRN

## 2017-10-04 MED ORDER — AZITHROMYCIN 250 MG PO TABS: 250.0000 mg | ORAL_TABLET | Freq: Every day | ORAL | 0 refills | Status: DC

## 2017-10-04 MED ORDER — FLUTICASONE PROPIONATE 50 MCG/ACT NA SUSP: 2.0000 | Freq: Every day | NASAL | 6 refills | Status: DC

## 2017-10-04 NOTE — ED Triage Notes (Signed)
PT C/O: cold sx associated w/prod cough w/bloody mucous  ONSET: 2 weeks   DENIES: fevers, chills  TAKING MEDS: OTC cough meds  A&O x4... NAD... Ambulatory

## 2017-10-04 NOTE — ED Provider Notes (Signed)
[place]    CSN: [id number] Arrival date & time: 10/04/17  1636     History   Chief Complaint Chief Complaint  Patient presents with  . URI    HPI Paula Guzman is a 31 y.o. female.   31 year old female comes in for 2-week history of URI symptoms.  She has had continued productive cough, postnasal drip.  States that coughing has been worse, and coughed up bloody mucus today.  She has had increased using her albuterol due to chest tightness, shortness of breath.  Denies fever, chills, night sweats.  OTC cold medication with some relief.  Never smoker.      Past Medical History:  Diagnosis Date  . Asthma    allergy related as a child    Patient Active Problem List   Diagnosis Date Noted  . Post-viral cough syndrome 08/14/2017  . Physical exam 06/01/2017  . Asthma   . Low grade squamous intraepithelial lesion (LGSIL) at risk for high grade squamous intraepithelial lesion (HGSIL) on cytologic smear of cervix 08/06/2016    Past Surgical History:  Procedure Laterality Date  . FOOT SURGERY Right   . WISDOM TOOTH EXTRACTION      OB History    No data available       Home Medications    Prior to Admission medications   Medication Sig Start Date End Date Taking? Authorizing Provider  albuterol (PROVENTIL HFA;VENTOLIN HFA) 108 (90 Base) MCG/ACT inhaler Inhale 2 puffs into the lungs every 6 (six) hours as needed for wheezing or shortness of breath. 06/01/17  Yes Midge Minium, MD  cetirizine (ZYRTEC) 10 MG tablet Take 1 tablet (10 mg total) by mouth daily. 06/01/17  Yes Midge Minium, MD  levonorgestrel (MIRENA) 20 MCG/24HR IUD 1 each by Intrauterine route once.   Yes [provider]  montelukast (SINGULAIR) 10 MG tablet Take 1 tablet (10 mg total) by mouth at bedtime. 05/18/17  Yes Anyanwu, Sallyanne Havers, MD  Multiple Vitamins-Minerals (MULTIVITAMIN WITH MINERALS) tablet Take 1 tablet by mouth daily.   Yes [provider]    Saccharomyces boulardii (PROBIOTIC) 250 MG CAPS Take 1 capsule by mouth daily.   Yes [provider]  azithromycin (ZITHROMAX) 250 MG tablet Take 1 tablet (250 mg total) by mouth daily. Take first 2 tablets together, then 1 every day until finished. 10/04/17   Tasia Catchings, Amy V, PA-C  benzonatate (TESSALON) 100 MG capsule Take 1 capsule (100 mg total) by mouth 3 (three) times daily as needed for cough. 10/04/17   Tasia Catchings, Amy V, PA-C  fluticasone (FLONASE) 50 MCG/ACT nasal spray Place 2 sprays into both nostrils daily. 10/04/17   Ok Edwards, PA-C    Family History Family History  Problem Relation Age of Onset  . Atrial fibrillation Father   . Cancer Paternal Grandmother        Lyphoma and Breast Cancer    Social History Social History   Tobacco Use  . Smoking status: Never Smoker  . Smokeless tobacco: Never Used  Substance Use Topics  . Alcohol use: Yes    Comment: social  . Drug use: No     Allergies   Patient has no known allergies.   Review of Systems Review of Systems  Reason unable to perform ROS: See HPI as above.     Physical Exam Triage Vital Signs ED Triage Vitals [10/04/17 1707]  Enc Vitals Group     BP 128/63     Pulse Rate  73     Resp 16     Temp 98.7 F (37.1 C)     Temp Source Oral     SpO2 98 %     Weight      Height      Head Circumference      Peak Flow      Pain Score      Pain Loc      Pain Edu?      Excl. in [place]?    No data found.  Updated Vital Signs BP 128/63 (BP Location: Left Arm)   Pulse 73   Temp 98.7 F (37.1 C) (Oral)   Resp 16   LMP 10/02/2017 (Exact Date)   SpO2 98%   Physical Exam  Constitutional: She is oriented to person, place, and time. She appears well-developed and well-nourished. No distress.  HENT:  Head: Normocephalic and atraumatic.  Right Ear: Tympanic membrane, external ear and ear canal normal. Tympanic membrane is not erythematous and not bulging.  Left Ear: Tympanic membrane, external ear and ear canal  normal. Tympanic membrane is not erythematous and not bulging.  Nose: Rhinorrhea present. Right sinus exhibits no maxillary sinus tenderness and no frontal sinus tenderness. Left sinus exhibits no maxillary sinus tenderness and no frontal sinus tenderness.  Mouth/Throat: Uvula is midline, oropharynx is clear and moist and mucous membranes are normal.  Eyes: Conjunctivae are normal. Pupils are equal, round, and reactive to light.  Neck: Normal range of motion. Neck supple.  Cardiovascular: Normal rate, regular rhythm and normal heart sounds. Exam reveals no gallop and no friction rub.  No murmur heard. Pulmonary/Chest: Effort normal and breath sounds normal. She has no decreased breath sounds. She has no wheezes. She has no rhonchi. She has no rales.  Lymphadenopathy:    She has no cervical adenopathy.  Neurological: She is alert and oriented to person, place, and time.  Skin: Skin is warm and dry.  Psychiatric: She has a normal mood and affect. Her behavior is normal. Judgment normal.     UC Treatments / Results  Labs (all labs ordered are listed, but only abnormal results are displayed) Labs Reviewed - No data to display  EKG  EKG Interpretation None       Radiology Dg Chest 2 View  Result Date: 10/04/2017 CLINICAL DATA:  Cough EXAM: CHEST  2 VIEW COMPARISON:  None. FINDINGS: Heart and mediastinal contours are within normal limits. No focal opacities or effusions. No acute bony abnormality. IMPRESSION: No active cardiopulmonary disease. Electronically Signed   By: Rolm Baptise M.D.   On: 10/04/2017 17:59    Procedures Procedures (including critical care time)  Medications Ordered in UC Medications - No data to display   Initial Impression / Assessment and Plan / UC Course  I have reviewed the triage vital signs and the nursing notes.  Pertinent labs & imaging results that were available during my care of the patient were reviewed by me and considered in my medical  decision making (see chart for details).    Chest x-ray negative for pneumonia.  Will treat for bronchitis with azithromycin.  Other symptomatic treatment discussed.  Push fluids.  Return precautions given.  Final Clinical Impressions(s) / UC Diagnoses   Final diagnoses:  Bronchitis    ED Discharge Orders        Ordered    azithromycin (ZITHROMAX) 250 MG tablet  Daily     10/04/17 1803    benzonatate (TESSALON) 100 MG capsule  3  times daily PRN     10/04/17 1804    fluticasone (FLONASE) 50 MCG/ACT nasal spray  Daily     10/04/17 1804        Ok Edwards, PA-C 10/04/17 1825

## 2017-10-04 NOTE — Discharge Instructions (Signed)
Chest x-ray negative for pneumonia.  Given duration of symptoms, start azithromycin for bronchitis.  Continue albuterol as needed for shortness of breath and wheezing.  Tessalon for cough. Start flonase, zyrtec for nasal congestion. You can use over the counter nasal saline rinse such as neti pot for nasal congestion. Keep hydrated, your urine should be clear to pale yellow in color. Tylenol/motrin for fever and pain. Monitor for any worsening of symptoms, chest pain, shortness of breath, wheezing, swelling of the throat, follow up for reevaluation.

## 2017-10-22 MED FILL — MONTELUKAST SOD 10 MG TAB: 10 | 30 days supply | Qty: 30 | Fill #4

## 2017-10-26 ENCOUNTER — Other Ambulatory Visit: Payer: Self-pay | Admitting: Family Medicine

## 2017-10-26 ENCOUNTER — Telehealth: Payer: Self-pay | Admitting: *Deleted

## 2017-10-26 DIAGNOSIS — Z0184 Encounter for antibody response examination: Secondary | ICD-10-CM

## 2017-10-26 NOTE — Telephone Encounter (Signed)
Ok for Hep B titer for immunity status testing

## 2017-10-26 NOTE — Telephone Encounter (Signed)
Copied from [place] [id number]. Topic: Appointment Scheduling - Scheduling Inquiry for Clinic >> Oct 23, 2017  3:16 PM Bea Graff, NT wrote: Reason for CRM: Patient is calling requesting an appt to have a hepatitis B titer drawn. No order in chart. Please schedule

## 2017-10-26 NOTE — Telephone Encounter (Signed)
[place] for titer?

## 2017-10-26 NOTE — Telephone Encounter (Signed)
LM for pt to CB to schedule appt °

## 2017-10-26 NOTE — Telephone Encounter (Signed)
[place] for lab visit for Hep B titer.

## 2017-10-29 ENCOUNTER — Ambulatory Visit (INDEPENDENT_AMBULATORY_CARE_PROVIDER_SITE_OTHER): Payer: 59 | Admitting: *Deleted

## 2017-10-29 DIAGNOSIS — Z0184 Encounter for antibody response examination: Secondary | ICD-10-CM | POA: Diagnosis not present

## 2017-10-30 ENCOUNTER — Encounter: Payer: Self-pay | Admitting: General Practice

## 2017-10-30 LAB — HEPATITIS B SURFACE ANTIBODY,QUALITATIVE: HEP B S AB: REACTIVE — AB

## 2018-01-11 ENCOUNTER — Other Ambulatory Visit: Payer: Self-pay

## 2018-01-11 ENCOUNTER — Ambulatory Visit: Payer: 59 | Admitting: Family Medicine

## 2018-01-11 ENCOUNTER — Encounter: Payer: Self-pay | Admitting: Family Medicine

## 2018-01-11 VITALS — BP 110/78 | HR 78 | Temp 98.0°F | Resp 17 | Ht 67.0 in | Wt 137.0 lb

## 2018-01-11 DIAGNOSIS — M25512 Pain in left shoulder: Secondary | ICD-10-CM

## 2018-01-11 DIAGNOSIS — J4521 Mild intermittent asthma with (acute) exacerbation: Secondary | ICD-10-CM

## 2018-01-11 MED ORDER — MELOXICAM 15 MG PO TABS: 15.0000 mg | ORAL_TABLET | Freq: Every day | ORAL | 1 refills | Status: DC

## 2018-01-11 MED ORDER — MONTELUKAST SODIUM 10 MG PO TABS: 10.0000 mg | ORAL_TABLET | Freq: Every day | ORAL | 1 refills | Status: DC

## 2018-01-11 MED FILL — MELOXICAM 15 MG TABLET: 15 | 30 days supply | Qty: 30 | Fill #0

## 2018-01-11 MED FILL — MONTELUKAST SOD 10 MG TAB: 10 | 90 days supply | Qty: 90 | Fill #0

## 2018-01-11 NOTE — Progress Notes (Signed)
   Subjective:    Patient ID: Paula Guzman, female    DOB: 01-13-1986, 32 y.o.   MRN: [id number]  HPI Shoulder pain- L shoulder.  Pt was lifting weights and developed soreness after the fact- several hrs later.  Took ibuprofen w/o relief.  Pt has been alternating Tylenol and Naproxen w/o improvement.  Also iced the first couple of days.  sxs started ~4 weeks ago.  Pain worsens w/ 'going backwards' (internal rotation) and reaching across.  R handed.  No changes to her baseline level of crepitus.  TTP over [facility] joint.   Review of Systems For ROS see HPI     Objective:   Physical Exam  Constitutional: She is oriented to person, place, and time. She appears well-developed and well-nourished. No distress.  HENT:  Head: Normocephalic and atraumatic.  Cardiovascular: Intact distal pulses.  Musculoskeletal: She exhibits tenderness (TTP over L AC joint) and deformity (swelling and TTP over L AC joint).  + Neers test, Hawkings test Pain w/ internal rotation of L shoulder and forward flexion  Neurological: She is alert and oriented to person, place, and time. She has normal reflexes. Coordination normal.  Vitals reviewed.         Assessment & Plan:  L shoulder pain- new.  Pt has TTP over L AC joint w/ swelling.  Start daily NSAID.  Refer to Ortho.  Pt expressed understanding and is in agreement w/ plan.

## 2018-01-11 NOTE — Patient Instructions (Signed)
Follow up as needed or as scheduled We'll call you with your Ortho appt Start the Meloxicam daily- take w/ food Avoid other NSAIDs but continue to take Tylenol for breakthrough pain Call with any questions or concerns Hang in there!!!

## 2018-01-13 DIAGNOSIS — M7542 Impingement syndrome of left shoulder: Secondary | ICD-10-CM | POA: Diagnosis not present

## 2018-02-03 DIAGNOSIS — H5213 Myopia, bilateral: Secondary | ICD-10-CM | POA: Diagnosis not present

## 2018-02-12 ENCOUNTER — Other Ambulatory Visit: Payer: Self-pay

## 2018-02-12 ENCOUNTER — Ambulatory Visit: Payer: 59 | Admitting: Family Medicine

## 2018-02-12 ENCOUNTER — Encounter: Payer: Self-pay | Admitting: Family Medicine

## 2018-02-12 VITALS — BP 112/80 | HR 91 | Temp 98.7°F | Resp 16 | Ht 67.0 in | Wt 128.5 lb

## 2018-02-12 DIAGNOSIS — R197 Diarrhea, unspecified: Secondary | ICD-10-CM

## 2018-02-12 DIAGNOSIS — K2970 Gastritis, unspecified, without bleeding: Secondary | ICD-10-CM | POA: Diagnosis not present

## 2018-02-12 DIAGNOSIS — M7542 Impingement syndrome of left shoulder: Secondary | ICD-10-CM | POA: Diagnosis not present

## 2018-02-12 MED ORDER — GI COCKTAIL ~~LOC~~
30.0000 mL | Freq: Once | ORAL | Status: AC
  Administered 2018-02-12: 30 mL via ORAL

## 2018-02-12 MED ORDER — SUCRALFATE 1 G PO TABS: 1.0000 g | ORAL_TABLET | Freq: Three times a day (TID) | ORAL | 0 refills | Status: DC

## 2018-02-12 MED ORDER — PANTOPRAZOLE SODIUM 40 MG PO TBEC: 40.0000 mg | DELAYED_RELEASE_TABLET | Freq: Every day | ORAL | 3 refills | Status: DC

## 2018-02-12 MED FILL — PANTOPRAZOLE SOD DR 40 MG T: 40 | 30 days supply | Qty: 30 | Fill #0

## 2018-02-12 MED FILL — SUCRALFATE 1 GM TABLET: 1 | 23 days supply | Qty: 90 | Fill #0

## 2018-02-12 NOTE — Patient Instructions (Signed)
Follow up as needed or as scheduled We'll notify you of your lab results and make any changes if needed START the Pantoprazole once daily to decrease acid production START the Carafate prior to each meal and before bed to allow the stomach to heal Immodium as needed to slow the stools If the frequency doesn't improve (or god forbid, worsens) let me know and we'll do stool studies Call with any questions or concerns Hang in there!!!

## 2018-02-12 NOTE — Progress Notes (Signed)
   Subjective:    Patient ID: Paula Guzman, female    DOB: 1986-07-25, 32 y.o.   MRN: [id number]  HPI Diarrhea- sxs started 4/21.  She thought this was bc she had eaten at a restaurant.  sxs lasted 6-7 days and then improved.  Was having 6-8 stools/day.  Stools were loose.  Returned 1-2 days ago.  Pt had 9 BMs yesterday- again loose.  No blood.  + mucous.  No recent travel.  No new or different foods.  + 'gnawing' epigastric pain.  Pain is worse 30-60 minutes after eating.  Pt is not currently on NSAIDs.  Tums improves the pain.  Pt reports she is under a lot of stress at home and w/ school.  No fever.   Review of Systems For ROS see HPI     Objective:   Physical Exam  Constitutional: She is oriented to person, place, and time. She appears well-developed and well-nourished. No distress.  HENT:  Head: Normocephalic and atraumatic.  MMM  Neck: Neck supple.  Cardiovascular: Normal rate, regular rhythm and intact distal pulses.  Pulmonary/Chest: Effort normal and breath sounds normal. No respiratory distress. She has no wheezes. She has no rales.  Abdominal: Soft. Bowel sounds are normal. She exhibits no distension. There is no tenderness. There is no rebound.  Lymphadenopathy:    She has no cervical adenopathy.  Neurological: She is alert and oriented to person, place, and time.  Skin: Skin is warm and dry.  Vitals reviewed.         Assessment & Plan:  Gastritis- new.  Epigastric pain improved s/p GI cocktail in office.  Start PPI, carafate.  Reviewed dietary and lifestyle modifications that could improve sxs.  Pt expressed understanding and is in agreement w/ plan.   Diarrhea- check labs to r/o metabolic cause or electrolyte abnormality.  Suspect this is IBS related and pt agrees.  If no improvement w/ Immodium and stress reduction, will get stool studies.  Pt expressed understanding and is in agreement w/ plan.

## 2018-02-13 LAB — HEPATIC FUNCTION PANEL
AG Ratio: 2 (calc) (ref 1.0–2.5)
ALKALINE PHOSPHATASE (APISO): 78 U/L (ref 33–115)
ALT: 21 U/L (ref 6–29)
AST: 18 U/L (ref 10–30)
Albumin: 4.8 g/dL (ref 3.6–5.1)
Bilirubin, Direct: 0.2 mg/dL (ref 0.0–0.2)
Globulin: 2.4 g/dL (calc) (ref 1.9–3.7)
Indirect Bilirubin: 0.6 mg/dL (calc) (ref 0.2–1.2)
TOTAL PROTEIN: 7.2 g/dL (ref 6.1–8.1)
Total Bilirubin: 0.8 mg/dL (ref 0.2–1.2)

## 2018-02-13 LAB — CBC WITH DIFFERENTIAL/PLATELET
BASOS ABS: 20 {cells}/uL (ref 0–200)
Basophils Relative: 0.3 %
EOS PCT: 0 %
Eosinophils Absolute: 0 cells/uL — ABNORMAL LOW (ref 15–500)
HCT: 41 % (ref 35.0–45.0)
Hemoglobin: 14.1 g/dL (ref 11.7–15.5)
Lymphs Abs: 328 cells/uL — ABNORMAL LOW (ref 850–3900)
MCH: 32 pg (ref 27.0–33.0)
MCHC: 34.4 g/dL (ref 32.0–36.0)
MCV: 93.2 fL (ref 80.0–100.0)
MONOS PCT: 1 %
MPV: 12.1 fL (ref 7.5–12.5)
NEUTROS ABS: 6285 {cells}/uL (ref 1500–7800)
Neutrophils Relative %: 93.8 %
PLATELETS: 210 10*3/uL (ref 140–400)
RBC: 4.4 10*6/uL (ref 3.80–5.10)
RDW: 11.6 % (ref 11.0–15.0)
Total Lymphocyte: 4.9 %
WBC mixed population: 67 cells/uL — ABNORMAL LOW (ref 200–950)
WBC: 6.7 10*3/uL (ref 3.8–10.8)

## 2018-02-13 LAB — BASIC METABOLIC PANEL
BUN: 12 mg/dL (ref 7–25)
CHLORIDE: 105 mmol/L (ref 98–110)
CO2: 25 mmol/L (ref 20–32)
Calcium: 9.8 mg/dL (ref 8.6–10.2)
Creat: 0.68 mg/dL (ref 0.50–1.10)
GLUCOSE: 121 mg/dL — AB (ref 65–99)
Potassium: 4.3 mmol/L (ref 3.5–5.3)
Sodium: 140 mmol/L (ref 135–146)

## 2018-02-13 LAB — TSH: TSH: 0.43 m[IU]/L

## 2018-02-15 ENCOUNTER — Encounter: Payer: Self-pay | Admitting: General Practice

## 2018-03-19 DIAGNOSIS — M24812 Other specific joint derangements of left shoulder, not elsewhere classified: Secondary | ICD-10-CM | POA: Diagnosis not present

## 2018-03-19 DIAGNOSIS — M25512 Pain in left shoulder: Secondary | ICD-10-CM | POA: Diagnosis not present

## 2018-05-03 MED FILL — MONTELUKAST SOD 10 MG TAB: 10 | 90 days supply | Qty: 90 | Fill #1

## 2018-05-11 IMAGING — DX DG CHEST 2V
2 series · 2 of 2 positions shown · non-contrast
Comparison: None.

CLINICAL DATA: Cough

EXAM:
CHEST  2 VIEW

[chest pa]
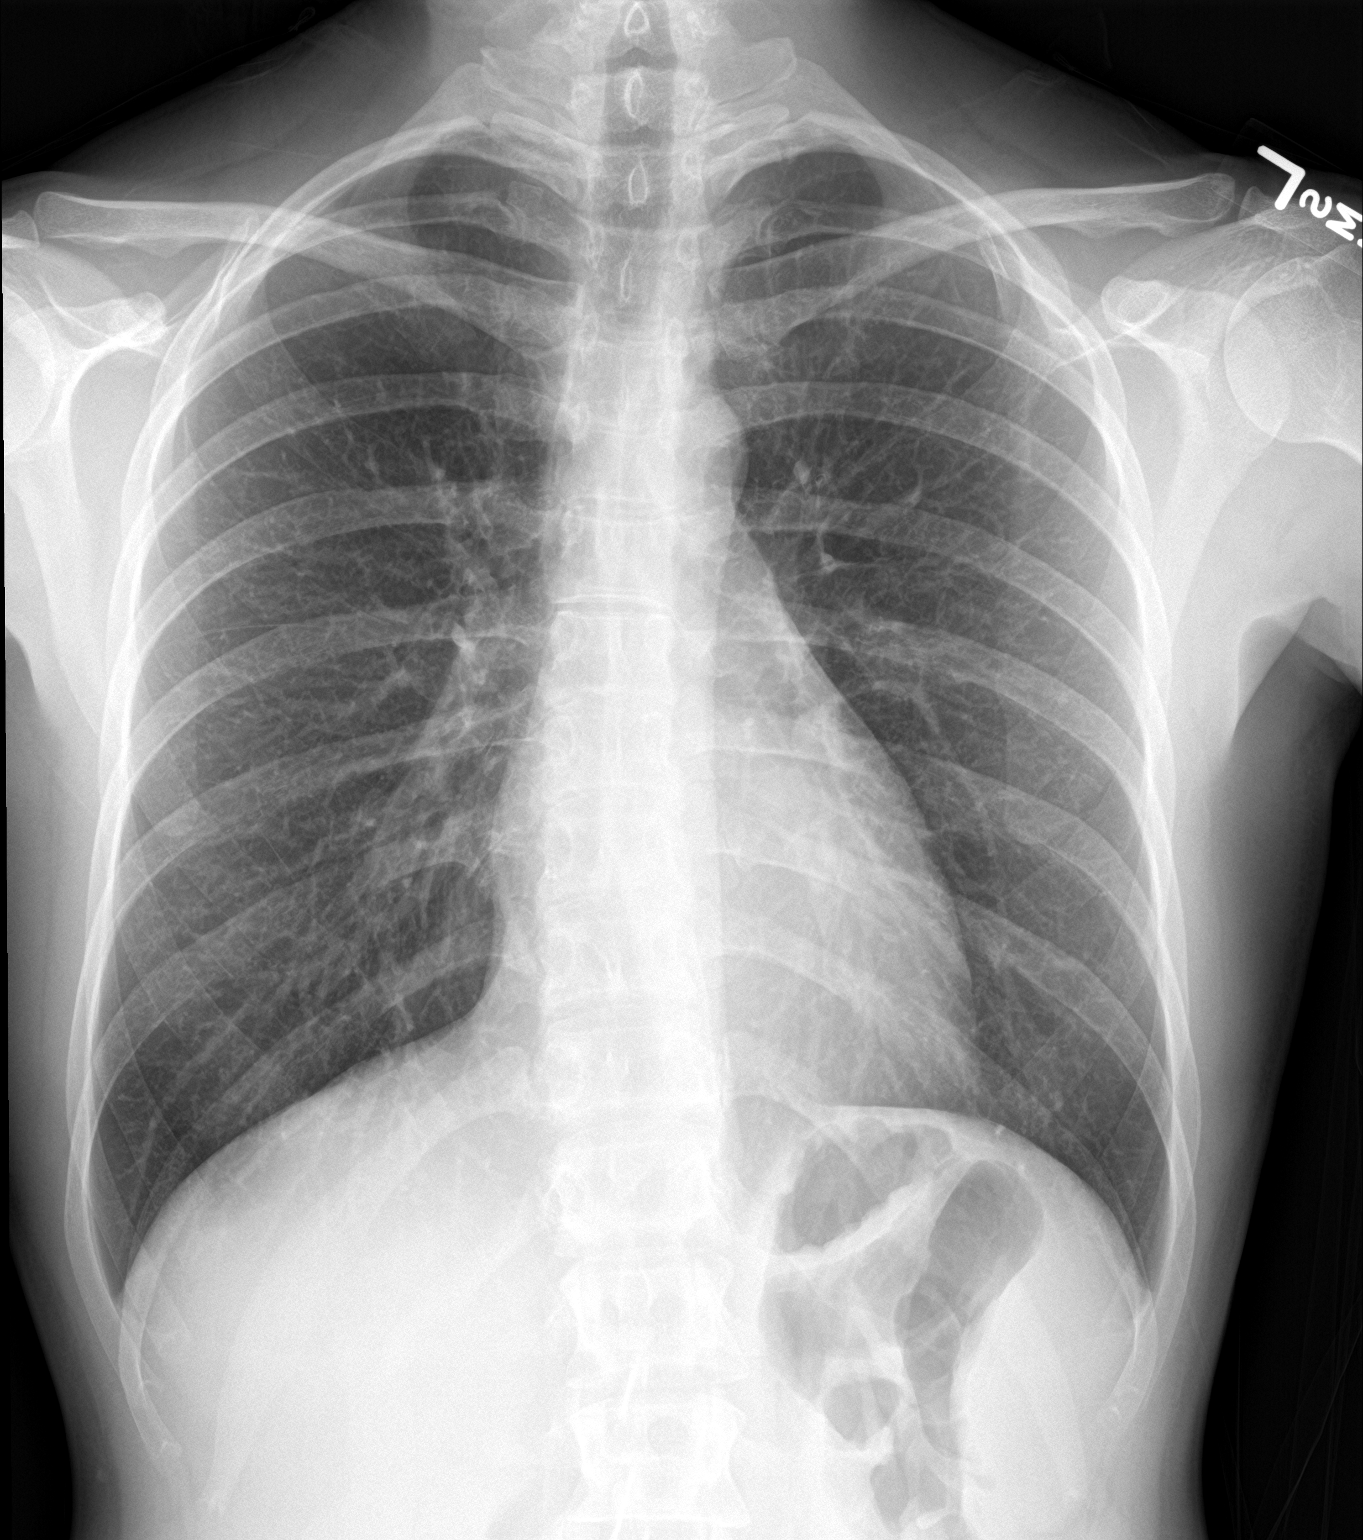

[chest lat]
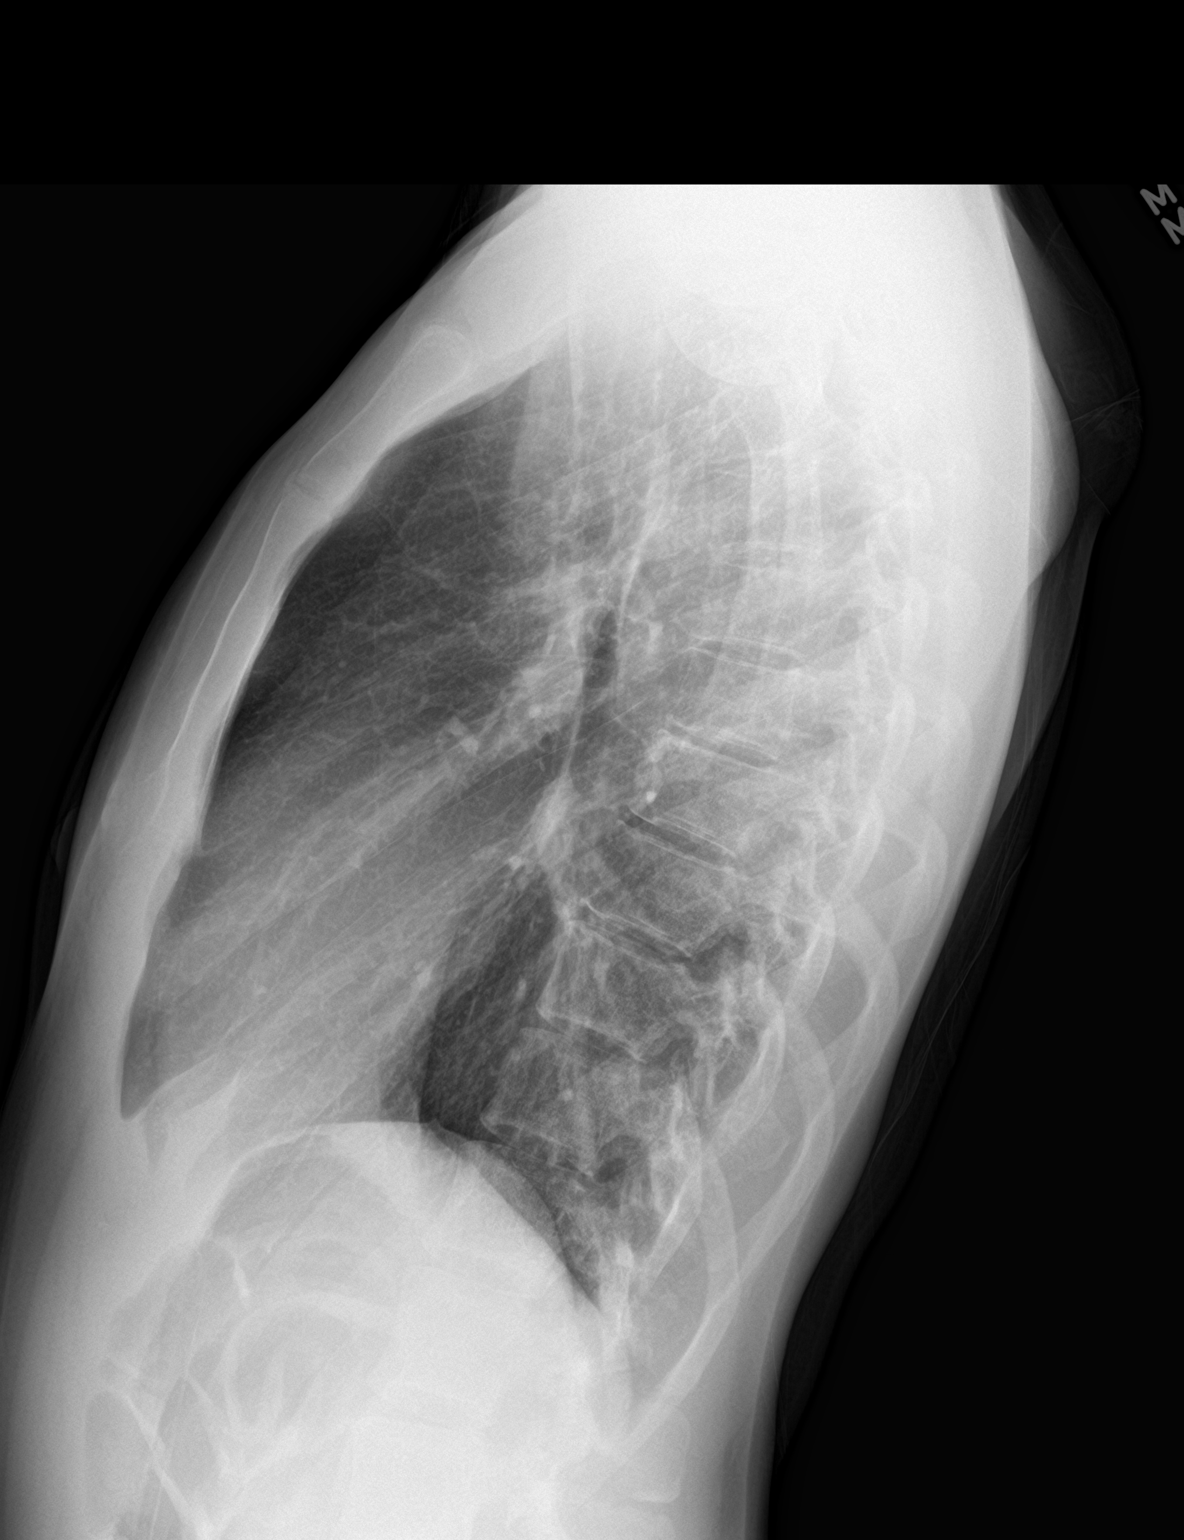

[2 of 2 positions shown; findings below may reference images not displayed]

FINDINGS: Heart and mediastinal contours are within normal limits. No focal
opacities or effusions. No acute bony abnormality.
IMPRESSION: No active cardiopulmonary disease.

## 2018-06-02 ENCOUNTER — Encounter: Payer: Self-pay | Admitting: Family Medicine

## 2018-06-02 ENCOUNTER — Ambulatory Visit (INDEPENDENT_AMBULATORY_CARE_PROVIDER_SITE_OTHER): Payer: 59 | Admitting: Family Medicine

## 2018-06-02 ENCOUNTER — Other Ambulatory Visit: Payer: Self-pay

## 2018-06-02 VITALS — BP 114/68 | HR 53 | Temp 98.6°F | Resp 16 | Ht 66.0 in | Wt 130.6 lb

## 2018-06-02 DIAGNOSIS — Z0000 Encounter for general adult medical examination without abnormal findings: Secondary | ICD-10-CM

## 2018-06-02 NOTE — Progress Notes (Signed)
   Subjective:    Patient ID: Paula Guzman, female    DOB: 14-Mar-1986, 32 y.o.   MRN: [id number]  HPI CPE- UTD on Tdap, will get flu shot at work.  UTD on pap.   Review of Systems Patient reports no vision/ hearing changes, adenopathy,fever, weight change,  persistant/recurrent hoarseness , swallowing issues, chest pain, palpitations, edema, persistant/recurrent cough, hemoptysis, dyspnea (rest/exertional/paroxysmal nocturnal), gastrointestinal bleeding (melena, rectal bleeding), abdominal pain, significant heartburn, bowel changes, GU symptoms (dysuria, hematuria, incontinence), Gyn symptoms (abnormal  bleeding, pain),  syncope, focal weakness, memory loss, numbness & tingling, skin/hair/nail changes, abnormal bruising or bleeding, anxiety, or depression.     Objective:   Physical Exam General Appearance:    Alert, cooperative, no distress, appears stated age  Head:    Normocephalic, without obvious abnormality, atraumatic  Eyes:    PERRL, conjunctiva/corneas clear, EOM's intact, fundi    benign, both eyes  Ears:    Normal TM's and external ear canals, both ears  Nose:   Nares normal, septum midline, mucosa normal, no drainage    or sinus tenderness  Throat:   Lips, mucosa, and tongue normal; teeth and gums normal  Neck:   Supple, symmetrical, trachea midline, no adenopathy;    Thyroid: no enlargement/tenderness/nodules  Back:     Symmetric, no curvature, ROM normal, no CVA tenderness  Lungs:     Clear to auscultation bilaterally, respirations unlabored  Chest Wall:    No tenderness or deformity   Heart:    Regular rate and rhythm, S1 and S2 normal, no murmur, rub   or gallop  Breast Exam:    Deferred to GYN  Abdomen:     Soft, non-tender, bowel sounds active all four quadrants,    no masses, no organomegaly  Genitalia:    Deferred to GYN  Rectal:    Extremities:   Extremities normal, atraumatic, no cyanosis or edema  Pulses:   2+ and symmetric all extremities  Skin:   Skin  color, texture, turgor normal, no rashes or lesions  Lymph nodes:   Cervical, supraclavicular, and axillary nodes normal  Neurologic:   CNII-XII intact, normal strength, sensation and reflexes    throughout          Assessment & Plan:

## 2018-06-02 NOTE — Patient Instructions (Signed)
Follow up in 1 year or as needed Keep up the good work!  You look great!!! GOOD LUCK WITH SCHOOL and the move! Call with any questions or concerns [place]!!!

## 2018-06-02 NOTE — Assessment & Plan Note (Signed)
Pt's PE WNL.  UTD on GYN.  Will get flu shot at work.  No need for labs as last year's labs were all WNL.  Anticipatory guidance provided.

## 2018-07-03 ENCOUNTER — Telehealth: Payer: 59 | Admitting: Nurse Practitioner

## 2018-07-03 DIAGNOSIS — N3000 Acute cystitis without hematuria: Secondary | ICD-10-CM

## 2018-07-03 MED ORDER — NITROFURANTOIN MONOHYD MACRO 100 MG PO CAPS: 100.0000 mg | ORAL_CAPSULE | Freq: Two times a day (BID) | ORAL | 0 refills | Status: DC

## 2018-07-03 NOTE — Progress Notes (Signed)

## 2018-08-10 ENCOUNTER — Ambulatory Visit (INDEPENDENT_AMBULATORY_CARE_PROVIDER_SITE_OTHER): Payer: 59

## 2018-08-10 DIAGNOSIS — Z111 Encounter for screening for respiratory tuberculosis: Secondary | ICD-10-CM

## 2018-08-10 NOTE — Progress Notes (Addendum)
Administered PPD on Pt. Left forearm. Tolerated well. Coming back on Thursday 08/12/2018 to have test read.

## 2018-08-12 ENCOUNTER — Telehealth: Payer: Self-pay

## 2018-08-13 LAB — TB SKIN TEST
Induration: 0 mm
TB Skin Test: NEGATIVE

## 2018-08-13 NOTE — Telephone Encounter (Signed)
Pt PPD normal reading

## 2018-08-19 ENCOUNTER — Encounter: Payer: Self-pay | Admitting: *Deleted

## 2018-08-27 ENCOUNTER — Ambulatory Visit (INDEPENDENT_AMBULATORY_CARE_PROVIDER_SITE_OTHER): Payer: 59

## 2018-08-27 ENCOUNTER — Other Ambulatory Visit (HOSPITAL_COMMUNITY)
Admission: RE | Admit: 2018-08-27 | Discharge: 2018-08-27 | Disposition: A | Discharge status: Home / Self Care | Payer: 59 | Source: Ambulatory Visit

## 2018-08-27 VITALS — BP 113/41 | HR 63 | Resp 16 | Ht 66.0 in | Wt 136.0 lb

## 2018-08-27 DIAGNOSIS — Z01419 Encounter for gynecological examination (general) (routine) without abnormal findings: Secondary | ICD-10-CM | POA: Diagnosis not present

## 2018-08-27 NOTE — Progress Notes (Signed)
GYNECOLOGY ANNUAL PREVENTATIVE CARE ENCOUNTER NOTE  Subjective:   Paula Guzman is a 32 y.o. G0P0000 female here for a routine annual gynecologic exam.  Current complaints: none.   Denies abnormal vaginal bleeding, discharge, pelvic pain, problems with intercourse or other gynecologic concerns.    Gynecologic History Patient's last menstrual period was 08/18/2018. Contraception: IUD Last Pap: 2014-2015. Results were: abnormal with negative HPV, had cryo done with normal follow up pap Last mammogram: n/a  Obstetric History OB History  Gravida Para Term Preterm AB Living  0 0 0 0 0 0  SAB TAB Ectopic Multiple Live Births  0 0 0 0 0    Past Medical History:  Diagnosis Date  . Asthma    allergy related as a child    Past Surgical History:  Procedure Laterality Date  . FOOT SURGERY Right   . WISDOM TOOTH EXTRACTION      Current Outpatient Medications on File Prior to Visit  Medication Sig Dispense Refill  . albuterol (PROVENTIL HFA;VENTOLIN HFA) 108 (90 Base) MCG/ACT inhaler Inhale 2 puffs into the lungs every 6 (six) hours as needed for wheezing or shortness of breath. 1 Inhaler 2  . cetirizine (ZYRTEC) 10 MG tablet Take 1 tablet (10 mg total) by mouth daily. 30 tablet 11  . levonorgestrel (MIRENA) 20 MCG/24HR IUD 1 each by Intrauterine route once.    . meloxicam (MOBIC) 15 MG tablet Take 1 tablet (15 mg total) by mouth daily. 30 tablet 1  . montelukast (SINGULAIR) 10 MG tablet Take 1 tablet (10 mg total) by mouth at bedtime. 90 tablet 1  . Multiple Vitamins-Minerals (MULTIVITAMIN WITH MINERALS) tablet Take 1 tablet by mouth daily.    . nitrofurantoin, macrocrystal-monohydrate, (MACROBID) 100 MG capsule Take 1 capsule (100 mg total) by mouth 2 (two) times daily. 1 po BId 10 capsule 0  . pantoprazole (PROTONIX) 40 MG tablet Take 1 tablet (40 mg total) by mouth daily. 30 tablet 3  . Saccharomyces boulardii (PROBIOTIC) 250 MG CAPS Take 1 capsule by mouth daily.    .  sucralfate (CARAFATE) 1 g tablet Take 1 tablet (1 g total) by mouth 4 (four) times daily -  with meals and at bedtime. 90 tablet 0   No current facility-administered medications on file prior to visit.     No Known Allergies  Social History:  reports that she has never smoked. She has never used smokeless tobacco. She reports that she drinks alcohol. She reports that she does not use drugs.  Family History  Problem Relation Age of Onset  . Atrial fibrillation Father   . Cancer Paternal Grandmother        Lyphoma and Breast Cancer    The following portions of the patient's history were reviewed and updated as appropriate: allergies, current medications, past family history, past medical history, past social history, past surgical history and problem list.  Review of Systems Pertinent items noted in HPI and remainder of comprehensive ROS otherwise negative.   Objective:  BP (!) 113/41   Pulse 63   Resp 16   Ht 5\' 6"  (1.676 m)   Wt 136 lb (61.7 kg)   LMP 08/18/2018   BMI 21.95 kg/m  CONSTITUTIONAL: Well-developed, well-nourished female in no acute distress.  HENT:  Normocephalic, atraumatic, External right and left ear normal. Oropharynx is clear and moist EYES: Conjunctivae and EOM are normal. Pupils are equal, round, and reactive to light. No scleral icterus.  NECK: Normal range of motion, supple,  no masses.  Normal thyroid.  SKIN: Skin is warm and dry. No rash noted. Not diaphoretic. No erythema. No pallor. MUSCULOSKELETAL: Normal range of motion. No tenderness.  No cyanosis, clubbing, or edema.  2+ distal pulses. NEUROLOGIC: Alert and oriented to person, place, and time. Normal reflexes, muscle tone coordination. No cranial nerve deficit noted. PSYCHIATRIC: Normal mood and affect. Normal behavior. Normal judgment and thought content. CARDIOVASCULAR: Normal heart rate noted, regular rhythm RESPIRATORY: Clear to auscultation bilaterally. Effort and breath sounds normal, no  problems with respiration noted. BREASTS: Symmetric in size. No masses, skin changes, nipple drainage, or lymphadenopathy. ABDOMEN: Soft, normal bowel sounds, no distention noted.  No tenderness, rebound or guarding.  PELVIC: Normal appearing external genitalia; normal appearing vaginal mucosa and cervix.  No abnormal discharge noted. IUD strings visualized Pap smear obtained.  Normal uterine size, no other palpable masses, no uterine or adnexal tenderness.  Assessment and Plan:  1. Well woman exam with routine gynecological exam - Normal exam - IUD in place  Will follow up results of pap smear and manage accordingly. Routine preventative health maintenance measures emphasized. Please refer to After Visit Summary for other counseling recommendations.   Wende Mott, CNM 08/27/18 10:59 AM

## 2018-08-31 LAB — CYTOLOGY - PAP
DIAGNOSIS: NEGATIVE
HPV (WINDOPATH): NOT DETECTED

## 2019-03-30 DIAGNOSIS — H5213 Myopia, bilateral: Secondary | ICD-10-CM | POA: Diagnosis not present

## 2019-04-14 ENCOUNTER — Telehealth: Payer: 59 | Admitting: Family

## 2019-04-14 DIAGNOSIS — R300 Dysuria: Secondary | ICD-10-CM | POA: Diagnosis not present

## 2019-04-14 MED ORDER — NITROFURANTOIN MONOHYD MACRO 100 MG PO CAPS: 100.0000 mg | ORAL_CAPSULE | Freq: Two times a day (BID) | ORAL | 0 refills | Status: DC

## 2019-04-14 NOTE — Progress Notes (Signed)

## 2019-04-14 NOTE — Addendum Note (Signed)
Addended by: Dutch Quint B on: 04/14/2019 04:06 PM   Modules accepted: Orders

## 2019-06-06 ENCOUNTER — Encounter: Payer: Self-pay | Admitting: Family Medicine

## 2019-06-06 ENCOUNTER — Ambulatory Visit (INDEPENDENT_AMBULATORY_CARE_PROVIDER_SITE_OTHER): Payer: 59 | Admitting: Family Medicine

## 2019-06-06 ENCOUNTER — Other Ambulatory Visit: Payer: Self-pay

## 2019-06-06 VITALS — BP 121/81 | HR 55 | Temp 98.1°F | Resp 16 | Ht 66.0 in | Wt 133.0 lb

## 2019-06-06 DIAGNOSIS — Z0000 Encounter for general adult medical examination without abnormal findings: Secondary | ICD-10-CM | POA: Diagnosis not present

## 2019-06-06 NOTE — Assessment & Plan Note (Signed)
Pt's PE WNL.  UTD on GYN.  Will get flu shot at work.  No need for labs.  Anticipatory guidance provided.

## 2019-06-06 NOTE — Patient Instructions (Signed)
Follow up in 1 year or as needed No need for labs today- yay!! Keep up the good work on healthy diet and regular exercise- you look great!! Call with any questions or concerns Stay Safe!!! Happy Early [place]!!!

## 2019-06-06 NOTE — Progress Notes (Signed)
   Subjective:    Patient ID: Paula Guzman, female    DOB: March 29, 1986, 33 y.o.   MRN: [id number]  HPI CPE- UTD on Gyn, Tdap, declines flu- will do at work   Review of Systems Patient reports no vision/ hearing changes, adenopathy,fever, weight change,  persistant/recurrent hoarseness , swallowing issues, chest pain, palpitations, edema, persistant/recurrent cough, hemoptysis, dyspnea (rest/exertional/paroxysmal nocturnal), gastrointestinal bleeding (melena, rectal bleeding), abdominal pain, significant heartburn, bowel changes, GU symptoms (dysuria, hematuria, incontinence), Gyn symptoms (abnormal  bleeding, pain),  syncope, focal weakness, memory loss, numbness & tingling, skin/hair/nail changes, abnormal bruising or bleeding, anxiety, or depression.     Objective:   Physical Exam General Appearance:    Alert, cooperative, no distress, appears stated age  Head:    Normocephalic, without obvious abnormality, atraumatic  Eyes:    PERRL, conjunctiva/corneas clear, EOM's intact, fundi    benign, both eyes  Ears:    Normal TM's and external ear canals, both ears  Nose:   Nares normal, septum midline, mucosa normal, no drainage    or sinus tenderness  Throat:   Lips, mucosa, and tongue normal; teeth and gums normal  Neck:   Supple, symmetrical, trachea midline, no adenopathy;    Thyroid: no enlargement/tenderness/nodules  Back:     Symmetric, no curvature, ROM normal, no CVA tenderness  Lungs:     Clear to auscultation bilaterally, respirations unlabored  Chest Wall:    No tenderness or deformity   Heart:    Regular rate and rhythm, S1 and S2 normal, no murmur, rub   or gallop  Breast Exam:    Deferred to GYN  Abdomen:     Soft, non-tender, bowel sounds active all four quadrants,    no masses, no organomegaly  Genitalia:    Deferred to GYN  Rectal:    Extremities:   Extremities normal, atraumatic, no cyanosis or edema  Pulses:   2+ and symmetric all extremities  Skin:   Skin color,  texture, turgor normal, no rashes or lesions  Lymph nodes:   Cervical, supraclavicular, and axillary nodes normal  Neurologic:   CNII-XII intact, normal strength, sensation and reflexes    throughout          Assessment & Plan:

## 2019-08-08 ENCOUNTER — Other Ambulatory Visit: Payer: Self-pay

## 2019-08-08 ENCOUNTER — Ambulatory Visit (INDEPENDENT_AMBULATORY_CARE_PROVIDER_SITE_OTHER): Payer: 59

## 2019-08-08 DIAGNOSIS — Z111 Encounter for screening for respiratory tuberculosis: Secondary | ICD-10-CM

## 2019-08-10 LAB — QUANTIFERON-TB GOLD PLUS
Mitogen-NIL: >10 IU/mL
NIL: 0.05 IU/mL
QuantiFERON-TB Gold Plus: NEGATIVE
TB1-NIL: 0 IU/mL
TB2-NIL: 0.02 IU/mL

## 2020-02-14 ENCOUNTER — Encounter: Payer: Self-pay | Admitting: Family Medicine

## 2020-02-14 DIAGNOSIS — Z0100 Encounter for examination of eyes and vision without abnormal findings: Secondary | ICD-10-CM

## 2020-04-18 ENCOUNTER — Encounter: Payer: Self-pay | Admitting: Family Medicine

## 2020-04-19 ENCOUNTER — Other Ambulatory Visit: Payer: Self-pay

## 2020-04-19 DIAGNOSIS — D229 Melanocytic nevi, unspecified: Secondary | ICD-10-CM

## 2020-04-19 NOTE — Telephone Encounter (Signed)
Reviewed in PCP absence. Ok to place referral.

## 2020-06-07 ENCOUNTER — Encounter: Payer: Self-pay | Admitting: Family Medicine

## 2020-06-07 ENCOUNTER — Ambulatory Visit (INDEPENDENT_AMBULATORY_CARE_PROVIDER_SITE_OTHER): Payer: No Typology Code available for payment source | Admitting: Family Medicine

## 2020-06-07 ENCOUNTER — Other Ambulatory Visit: Payer: Self-pay

## 2020-06-07 VITALS — BP 100/67 | HR 57 | Temp 99.7°F | Resp 16 | Ht 66.0 in | Wt 133.5 lb

## 2020-06-07 DIAGNOSIS — Z0000 Encounter for general adult medical examination without abnormal findings: Secondary | ICD-10-CM | POA: Diagnosis not present

## 2020-06-07 LAB — CBC WITH DIFFERENTIAL/PLATELET
Basophils Absolute: 0 10*3/uL (ref 0.0–0.1)
Basophils Relative: 0.6 % (ref 0.0–3.0)
Eosinophils Absolute: 0.1 10*3/uL (ref 0.0–0.7)
Eosinophils Relative: 1.9 % (ref 0.0–5.0)
HCT: 38.1 % (ref 36.0–46.0)
Hemoglobin: 13.2 g/dL (ref 12.0–15.0)
Lymphocytes Relative: 29.7 % (ref 12.0–46.0)
Lymphs Abs: 1.4 10*3/uL (ref 0.7–4.0)
MCHC: 34.7 g/dL (ref 30.0–36.0)
MCV: 94.9 fl (ref 78.0–100.0)
Monocytes Absolute: 0.3 10*3/uL (ref 0.1–1.0)
Monocytes Relative: 7 % (ref 3.0–12.0)
Neutro Abs: 2.9 10*3/uL (ref 1.4–7.7)
Neutrophils Relative %: 60.8 % (ref 43.0–77.0)
Platelets: 172 10*3/uL (ref 150.0–400.0)
RBC: 4.01 Mil/uL (ref 3.87–5.11)
RDW: 12.9 % (ref 11.5–15.5)
WBC: 4.8 10*3/uL (ref 4.0–10.5)

## 2020-06-07 LAB — HEPATIC FUNCTION PANEL
ALT: 12 U/L (ref 0–35)
AST: 12 U/L (ref 0–37)
Albumin: 4.5 g/dL (ref 3.5–5.2)
Alkaline Phosphatase: 50 U/L (ref 39–117)
Bilirubin, Direct: 0.1 mg/dL (ref 0.0–0.3)
Total Bilirubin: 0.8 mg/dL (ref 0.2–1.2)
Total Protein: 6.7 g/dL (ref 6.0–8.3)

## 2020-06-07 LAB — BASIC METABOLIC PANEL
BUN: 15 mg/dL (ref 6–23)
CO2: 29 mEq/L (ref 19–32)
Calcium: 9.4 mg/dL (ref 8.4–10.5)
Chloride: 105 mEq/L (ref 96–112)
Creatinine, Ser: 0.61 mg/dL (ref 0.40–1.20)
GFR: 113.66 mL/min (ref >60.00)
Glucose, Bld: 91 mg/dL (ref 70–99)
Potassium: 4.1 mEq/L (ref 3.5–5.1)
Sodium: 139 mEq/L (ref 135–145)

## 2020-06-07 LAB — LIPID PANEL
Cholesterol: 143 mg/dL (ref 0–200)
HDL: 60.6 mg/dL (ref >39.00)
LDL Cholesterol: 72 mg/dL (ref 0–99)
NonHDL: 82.87
Total CHOL/HDL Ratio: 2
Triglycerides: 52 mg/dL (ref 0.0–149.0)
VLDL: 10.4 mg/dL (ref 0.0–40.0)

## 2020-06-07 LAB — TSH: TSH: 1.67 u[IU]/mL (ref 0.35–4.50)

## 2020-06-07 NOTE — Progress Notes (Signed)
   Subjective:    Patient ID: Paula Guzman, female    DOB: [birth year], 34 y.o.   MRN: [id number]  HPI CPE- UTD on Tdap, COVID vaccine.  Wants to wait on flu.  UTD on GYN.  No concerns today  Reviewed past medical, surgical, family and social histories.   Patient Care Team    Relationship Specialty Notifications Start End  Midge Minium, MD PCP - General Family Medicine  06/02/16   Jerelyn Charles, MD Consulting Physician Obstetrics  05/28/16   Macarthur Critchley, [place] Referring Physician Optometry  05/28/16      Health Maintenance  Topic Date Due  . INFLUENZA VACCINE  05/13/2020  . Hepatitis C Screening  06/07/2021 (Originally May 15, 1986)  . HIV Screening  06/07/2021 (Originally 06/08/2003)  . PAP SMEAR-Modifier  08/27/2021  . TETANUS/TDAP  05/01/2027  . COVID-19 Vaccine  Completed      Review of Systems Patient reports no vision/ hearing changes, adenopathy,fever, weight change,  persistant/recurrent hoarseness , swallowing issues, chest pain, palpitations, edema, persistant/recurrent cough, hemoptysis, dyspnea (rest/exertional/paroxysmal nocturnal), gastrointestinal bleeding (melena, rectal bleeding), abdominal pain, significant heartburn, bowel changes, GU symptoms (dysuria, hematuria, incontinence), Gyn symptoms (abnormal  bleeding, pain),  syncope, focal weakness, memory loss, numbness & tingling, skin/hair/nail changes, abnormal bruising or bleeding, anxiety, or depression.   This visit occurred during the SARS-CoV-2 public health emergency.  Safety protocols were in place, including screening questions prior to the visit, additional usage of staff PPE, and extensive cleaning of exam room while observing appropriate contact time as indicated for disinfecting solutions.       Objective:   Physical Exam General Appearance:    Alert, cooperative, no distress, appears stated age  Head:    Normocephalic, without obvious abnormality, atraumatic  Eyes:    PERRL, conjunctiva/corneas  clear, EOM's intact, fundi    benign, both eyes  Ears:    Normal TM's and external ear canals, both ears  Nose:   Deferred due to COVID  Throat:   Neck:   Supple, symmetrical, trachea midline, no adenopathy;    Thyroid: no enlargement/tenderness/nodules  Back:     Symmetric, no curvature, ROM normal, no CVA tenderness  Lungs:     Clear to auscultation bilaterally, respirations unlabored  Chest Wall:    No tenderness or deformity   Heart:    Regular rate and rhythm, S1 and S2 normal, no murmur, rub   or gallop  Breast Exam:    Deferred to GYN  Abdomen:     Soft, non-tender, bowel sounds active all four quadrants,    no masses, no organomegaly  Genitalia:    Deferred to GYN  Rectal:    Extremities:   Extremities normal, atraumatic, no cyanosis or edema  Pulses:   2+ and symmetric all extremities  Skin:   Skin color, texture, turgor normal, no rashes or lesions  Lymph nodes:   Cervical, supraclavicular, and axillary nodes normal  Neurologic:   CNII-XII intact, normal strength, sensation and reflexes    throughout          Assessment & Plan:

## 2020-06-07 NOTE — Assessment & Plan Note (Signed)
Pt's PE WNL.  UTD on GYN, COVID, Tdap.  Will hold on flu.  Will get labs as it has been a few years.  Anticipatory guidance provided.

## 2020-06-07 NOTE — Patient Instructions (Signed)
Follow up in 1 year or as needed We'll notify you of your lab results and make any changes if needed Keep up the good work on healthy diet and regular exercise- you look great!!! Call with any questions or concerns Stay Safe!  Stay Healthy! 

## 2020-06-08 ENCOUNTER — Encounter: Payer: Self-pay | Admitting: General Practice

## 2021-04-10 ENCOUNTER — Encounter: Payer: Self-pay | Admitting: *Deleted

## 2021-06-10 ENCOUNTER — Encounter: Payer: No Typology Code available for payment source | Admitting: Family Medicine

## 2021-07-22 ENCOUNTER — Ambulatory Visit (INDEPENDENT_AMBULATORY_CARE_PROVIDER_SITE_OTHER): Payer: No Typology Code available for payment source | Admitting: Family Medicine

## 2021-07-22 ENCOUNTER — Other Ambulatory Visit: Payer: Self-pay

## 2021-07-22 ENCOUNTER — Encounter: Payer: Self-pay | Admitting: Family Medicine

## 2021-07-22 ENCOUNTER — Other Ambulatory Visit (HOSPITAL_COMMUNITY): Payer: Self-pay

## 2021-07-22 VITALS — BP 117/78 | HR 62 | Temp 98.6°F | Resp 19 | Ht 66.8 in | Wt 134.6 lb

## 2021-07-22 DIAGNOSIS — Z0000 Encounter for general adult medical examination without abnormal findings: Secondary | ICD-10-CM

## 2021-07-22 MED ORDER — ALBUTEROL SULFATE HFA 108 (90 BASE) MCG/ACT IN AERS
2.0000 | INHALATION_SPRAY | Freq: Four times a day (QID) | RESPIRATORY_TRACT | 2 refills | Status: DC | PRN
  Filled 2021-07-22: qty 18, 25d supply, fill #0

## 2021-07-22 NOTE — Progress Notes (Signed)
   Subjective:    Patient ID: Paula Guzman, female    DOB: 22-Aug-1986, 35 y.o.   MRN: [id number]  HPI CPE- UTD on Tdap, pap, flu.  No concerns  Patient Care Team    Relationship Specialty Notifications Start End  Midge Minium, MD PCP - General Family Medicine  06/02/16   Jerelyn Charles, MD Consulting Physician Obstetrics  05/28/16   Macarthur Critchley, [place] Referring Physician Optometry  05/28/16     Health Maintenance  Topic Date Due   HIV Screening  Never done   Hepatitis C Screening  Never done   COVID-19 Vaccine (3 - Booster for Pfizer series) 03/26/2020   PAP SMEAR-Modifier  08/27/2021   TETANUS/TDAP  05/01/2027   INFLUENZA VACCINE  Completed   HPV VACCINES  Aged Out      Review of Systems Patient reports no vision/ hearing changes, adenopathy,fever, weight change,  persistant/recurrent hoarseness , swallowing issues, chest pain, palpitations, edema, persistant/recurrent cough, hemoptysis, dyspnea (rest/exertional/paroxysmal nocturnal), gastrointestinal bleeding (melena, rectal bleeding), abdominal pain, significant heartburn, bowel changes, GU symptoms (dysuria, hematuria, incontinence), Gyn symptoms (abnormal  bleeding, pain),  syncope, focal weakness, memory loss, numbness & tingling, skin/hair/nail changes, abnormal bruising or bleeding, anxiety, or depression.   This visit occurred during the SARS-CoV-2 public health emergency.  Safety protocols were in place, including screening questions prior to the visit, additional usage of staff PPE, and extensive cleaning of exam room while observing appropriate contact time as indicated for disinfecting solutions.      Objective:   Physical Exam General Appearance:    Alert, cooperative, no distress, appears stated age  Head:    Normocephalic, without obvious abnormality, atraumatic  Eyes:    PERRL, conjunctiva/corneas clear, EOM's intact, fundi    benign, both eyes  Ears:    Normal TM's and external ear canals, both ears  Nose:    Deferred due to COVID  Throat:   Neck:   Supple, symmetrical, trachea midline, no adenopathy;    Thyroid: no enlargement/tenderness/nodules  Back:     Symmetric, no curvature, ROM normal, no CVA tenderness  Lungs:     Clear to auscultation bilaterally, respirations unlabored  Chest Wall:    No tenderness or deformity   Heart:    Regular rate and rhythm, S1 and S2 normal, no murmur, rub   or gallop  Breast Exam:    Deferred to GYN  Abdomen:     Soft, non-tender, bowel sounds active all four quadrants,    no masses, no organomegaly  Genitalia:    Deferred to GYN  Rectal:    Extremities:   Extremities normal, atraumatic, no cyanosis or edema  Pulses:   2+ and symmetric all extremities  Skin:   Skin color, texture, turgor normal, no rashes or lesions  Lymph nodes:   Cervical, supraclavicular, and axillary nodes normal  Neurologic:   CNII-XII intact, normal strength, sensation and reflexes    throughout          Assessment & Plan:

## 2021-07-22 NOTE — Patient Instructions (Signed)
Follow up in 1 year or as needed No need for labs today- yay!!! Keep up the good work!  You look great!!! Call with any questions or concerns Stay Safe!  Stay Healthy! CONGRATS!!!

## 2021-07-22 NOTE — Assessment & Plan Note (Signed)
Pt's PE WNL.  UTD on pap, immunizations.  No need for labs based on lack of risk factors.  Anticipatory guidance provided.

## 2022-04-03 ENCOUNTER — Other Ambulatory Visit (HOSPITAL_COMMUNITY): Payer: Self-pay

## 2022-04-03 ENCOUNTER — Encounter: Payer: Self-pay | Admitting: Podiatry

## 2022-04-03 ENCOUNTER — Ambulatory Visit (INDEPENDENT_AMBULATORY_CARE_PROVIDER_SITE_OTHER): Payer: No Typology Code available for payment source | Admitting: Podiatry

## 2022-04-03 DIAGNOSIS — B351 Tinea unguium: Secondary | ICD-10-CM

## 2022-04-03 MED ORDER — TERBINAFINE HCL 250 MG PO TABS
250.0000 mg | ORAL_TABLET | Freq: Every day | ORAL | 0 refills | Status: DC
  Filled 2022-04-03: qty 90, 90d supply, fill #0

## 2022-04-04 LAB — HEPATIC FUNCTION PANEL
ALT: 14 IU/L (ref 0–32)
AST: 14 IU/L (ref 0–40)
Albumin: 5.1 g/dL — ABNORMAL HIGH (ref 3.8–4.8)
Alkaline Phosphatase: 64 IU/L (ref 44–121)
Bilirubin Total: 0.8 mg/dL (ref 0.0–1.2)
Bilirubin, Direct: 0.19 mg/dL (ref 0.00–0.40)
Total Protein: 7.2 g/dL (ref 6.0–8.5)

## 2022-04-13 ENCOUNTER — Encounter: Payer: Self-pay | Admitting: Family Medicine

## 2022-04-17 ENCOUNTER — Emergency Department
Admission: RE | Admit: 2022-04-17 | Discharge: 2022-04-17 | Disposition: A | Discharge status: Home / Self Care | Payer: No Typology Code available for payment source | Source: Ambulatory Visit

## 2022-04-17 ENCOUNTER — Emergency Department (INDEPENDENT_AMBULATORY_CARE_PROVIDER_SITE_OTHER): Payer: No Typology Code available for payment source

## 2022-04-17 VITALS — BP 125/79 | HR 66 | Temp 99.2°F | Resp 18 | Ht 66.0 in | Wt 132.0 lb

## 2022-04-17 DIAGNOSIS — M79672 Pain in left foot: Secondary | ICD-10-CM | POA: Diagnosis not present

## 2022-04-17 NOTE — ED Provider Notes (Signed)
[facility]    CSN: [id number] Arrival date & time: 04/17/22  1548      History   Chief Complaint Chief Complaint  Patient presents with   Foot Injury    Injured foot on vacation last week, having foot pain/tenderness, concern for possible fracture - Entered by patient   Appointment    HPI Paula Guzman is a 36 y.o. female.   HPI Pleasant 36 year old female presents while left foot injury/left foot pain playing game in pool 1 week ago.  Patient is unable to bear weight on left foot.  PMH significant for asthma.  Past Medical History:  Diagnosis Date   Asthma    allergy related as a child    Patient Active Problem List   Diagnosis Date Noted   Physical exam 06/01/2017   Asthma    Low grade squamous intraepithelial lesion (LGSIL) at risk for high grade squamous intraepithelial lesion (HGSIL) on cytologic smear of cervix 08/06/2016    Past Surgical History:  Procedure Laterality Date   FOOT SURGERY Right    WISDOM TOOTH EXTRACTION      OB History     Gravida  0   Para  0   Term  0   Preterm  0   AB  0   Living  0      SAB  0   IAB  0   Ectopic  0   Multiple  0   Live Births  0            Home Medications    Prior to Admission medications   Medication Sig Start Date End Date Taking? Authorizing Provider  albuterol (VENTOLIN HFA) 108 (90 Base) MCG/ACT inhaler Inhale 2 puffs into the lungs every 6 (six) hours as needed for wheezing or shortness of breath. 07/22/21  Yes Midge Minium, MD  cetirizine (ZYRTEC) 10 MG tablet Take 1 tablet (10 mg total) by mouth daily. 06/01/17  Yes Midge Minium, MD  levonorgestrel (MIRENA) 20 MCG/24HR IUD 1 each by Intrauterine route once.   Yes [provider]  Multiple Vitamins-Minerals (MULTIVITAMIN WITH MINERALS) tablet Take 1 tablet by mouth daily.   Yes [provider]  Saccharomyces boulardii (PROBIOTIC) 250 MG CAPS Take 1 capsule by mouth daily.   Yes  [provider]  terbinafine (LAMISIL) 250 MG tablet Take 1 tablet (250 mg total) by mouth daily. 04/03/22  Yes Regal, Tamala Fothergill, DPM    Family History Family History  Problem Relation Age of Onset   Atrial fibrillation Father    Cancer Paternal Grandmother        Lyphoma and Breast Cancer    Social History Social History   Tobacco Use   Smoking status: Never   Smokeless tobacco: Never  Vaping Use   Vaping Use: Never used  Substance Use Topics   Alcohol use: Yes    Comment: social   Drug use: No     Allergies   Patient has no known allergies.   Review of Systems Review of Systems  Musculoskeletal:        Left foot pain x1 week  All other systems reviewed and are negative.    Physical Exam Triage Vital Signs ED Triage Vitals  Enc Vitals Group     BP 04/17/22 1603 125/79     Pulse Rate 04/17/22 1603 66     Resp 04/17/22 1603 18     Temp 04/17/22 1603 99.2 F (37.3 C)  Temp Source 04/17/22 1603 Oral     SpO2 04/17/22 1603 100 %     Weight 04/17/22 1605 132 lb (59.9 kg)     Height 04/17/22 1605 '5\' 6"'$  (1.676 m)     Head Circumference --      Peak Flow --      Pain Score 04/17/22 1605 4     Pain Loc --      Pain Edu? --      Excl. in [place]? --    No data found.  Updated Vital Signs BP 125/79 (BP Location: Right Arm)   Pulse 66   Temp 99.2 F (37.3 C) (Oral)   Resp 18   Ht '5\' 6"'$  (1.676 m)   Wt 132 lb (59.9 kg)   SpO2 100%   BMI 21.31 kg/m      Physical Exam Vitals and nursing note reviewed.  Constitutional:      Appearance: Normal appearance. She is normal weight.  HENT:     Head: Normocephalic and atraumatic.     Mouth/Throat:     Mouth: Mucous membranes are moist.     Pharynx: Oropharynx is clear.  Eyes:     Extraocular Movements: Extraocular movements intact.     Conjunctiva/sclera: Conjunctivae normal.     Pupils: Pupils are equal, round, and reactive to light.  Cardiovascular:     Rate and Rhythm: Normal rate and regular  rhythm.     Pulses: Normal pulses.     Heart sounds: Normal heart sounds.  Pulmonary:     Effort: Pulmonary effort is normal.     Breath sounds: Normal breath sounds. No wheezing, rhonchi or rales.  Musculoskeletal:     Cervical back: Normal range of motion and neck supple.     Comments: Left foot: TTP with moderate soft tissue swelling noted over base of fifth metatarsal  Neurological:     General: No focal deficit present.     Mental Status: She is alert and oriented to person, place, and time.     Gait: Gait abnormal.      UC Treatments / Results  Labs (all labs ordered are listed, but only abnormal results are displayed) Labs Reviewed - No data to display  EKG   Radiology DG Foot Complete Left  Result Date: 04/17/2022 CLINICAL DATA:  Left foot pain after trauma 1 week ago. Persistent lateral pain. EXAM: LEFT FOOT - COMPLETE 3+ VIEW COMPARISON:  None Available. FINDINGS: Small Achilles and tiny calcaneal spurs. No acute fracture or dislocation. IMPRESSION: No acute osseous abnormality. Electronically Signed   By: Abigail Miyamoto M.D.   On: 04/17/2022 16:20    Procedures Procedures (including critical care time)  Medications Ordered in UC Medications - No data to display  Initial Impression / Assessment and Plan / UC Course  I have reviewed the triage vital signs and the nursing notes.  Pertinent labs & imaging results that were available during my care of the patient were reviewed by me and considered in my medical decision making (see chart for details).     MDM: 1.  Left foot pain-left foot x-ray revealed above. Advised patient of left foot x-ray results with hard copy provided to patient.  Advised patient may RICE left foot for 25 minutes 3 times daily for the next 3 days advised may use OTC Ibuprofen 600 to 800 mg 1-2 times daily, as needed.  Encouraged patient to increase daily water intake while taking this medication.  Advised if symptoms worsen and/or  unresolved  please follow-up with [facility] orthopedic provider contact information is below.  Patient discharged home, hemodynamically stable.  Final Clinical Impressions(s) / UC Diagnoses   Final diagnoses:  Left foot pain     Discharge Instructions      Advised patient of left foot x-ray results with hard copy provided to patient.  Advised patient may RICE left foot for 25 minutes 3 times daily for the next 3 days advised may use OTC Ibuprofen 600 to 800 mg 1-2 times daily, as needed.  Encouraged patient to increase daily water intake while taking this medication.  Advised if symptoms worsen and/or unresolved please follow-up with [facility] orthopedic provider contact information is below.     ED Prescriptions   None    PDMP not reviewed this encounter.   Eliezer Lofts, [place] 04/17/22 1643

## 2022-04-17 NOTE — ED Triage Notes (Signed)
Patient c/o left foot injury while playing a game in the pool x 1 week ago.  Patient was unable to bare weight on her foot, still painful.  Patient has taken Tylenol and Ibuprofen.

## 2022-04-17 NOTE — Discharge Instructions (Addendum)
Advised patient of left foot x-ray results with hard copy provided to patient.  Advised patient may RICE left foot for 25 minutes 3 times daily for the next 3 days advised may use OTC Ibuprofen 600 to 800 mg 1-2 times daily, as needed.  Encouraged patient to increase daily water intake while taking this medication.  Advised if symptoms worsen and/or unresolved please follow-up with [facility] orthopedic provider contact information is below.

## 2022-04-21 ENCOUNTER — Ambulatory Visit (INDEPENDENT_AMBULATORY_CARE_PROVIDER_SITE_OTHER): Payer: No Typology Code available for payment source

## 2022-04-21 DIAGNOSIS — B351 Tinea unguium: Secondary | ICD-10-CM

## 2022-04-21 NOTE — Progress Notes (Signed)
Patient presents today for the 1st laser treatment. Diagnosed with mycotic nail infection by Dr. Paulla Dolly.   Toenail most affected 1st bilateral.  All other systems are negative.  Nails were filed thin. Laser therapy was administered to 1-5 toenails bilateral and patient tolerated the treatment well. All safety precautions were in place.   Patient reports will start taking Lamisil Rx as directed. Just returned home from vacation and wanted to wait until she returned before taking the medication.  Follow up in 4 weeks for laser # 2.

## 2022-05-30 ENCOUNTER — Other Ambulatory Visit: Payer: No Typology Code available for payment source

## 2022-06-02 ENCOUNTER — Ambulatory Visit (INDEPENDENT_AMBULATORY_CARE_PROVIDER_SITE_OTHER): Payer: No Typology Code available for payment source | Admitting: *Deleted

## 2022-06-02 DIAGNOSIS — B351 Tinea unguium: Secondary | ICD-10-CM

## 2022-06-02 NOTE — Progress Notes (Signed)
Patient presents today for the 2nd laser treatment. Diagnosed with mycotic nail infection by Dr. Paulla Dolly.   Toenail most affected hallux nails bilateral.  All other systems are negative.  Nails were filed thin. Laser therapy was administered to 1-5 toenails bilateral and patient tolerated the treatment well. All safety precautions were in place.   She has started the oral lamisil treatment. She has completed about 30 days of 90 at this time.  Single pass over non-affected nails.  Patient reports will start taking Lamisil Rx as directed. Just returned home from vacation and wanted to wait until she returned before taking the medication.  Follow up in 4 weeks for laser # 3.

## 2022-06-30 ENCOUNTER — Ambulatory Visit: Payer: No Typology Code available for payment source

## 2022-06-30 DIAGNOSIS — B351 Tinea unguium: Secondary | ICD-10-CM

## 2022-06-30 NOTE — Progress Notes (Signed)
Patient presents today for the 3rd laser treatment. Diagnosed with mycotic nail infection by Dr. Paulla Dolly.   Toenail most affected hallux nails bilateral.  All other systems are negative.  Nails were filed thin. Laser therapy was administered to 1-5 toenails bilateral and patient tolerated the treatment well. All safety precautions were in place.   She has started the oral lamisil treatment. She has completed about 30 days of 90 at this time.  Single pass over non-affected nails.  Patient reports will start taking Lamisil Rx as directed. Just returned home from vacation and wanted to wait until she returned before taking the medication.  Follow up in 6 weeks for laser # 4.

## 2022-07-24 ENCOUNTER — Encounter: Payer: Self-pay | Admitting: Family Medicine

## 2022-07-24 ENCOUNTER — Other Ambulatory Visit (HOSPITAL_COMMUNITY): Payer: Self-pay

## 2022-07-24 ENCOUNTER — Ambulatory Visit (INDEPENDENT_AMBULATORY_CARE_PROVIDER_SITE_OTHER): Payer: No Typology Code available for payment source | Admitting: Family Medicine

## 2022-07-24 VITALS — BP 110/68 | HR 62 | Temp 98.4°F | Resp 16 | Ht 66.0 in | Wt 134.5 lb

## 2022-07-24 DIAGNOSIS — Z0000 Encounter for general adult medical examination without abnormal findings: Secondary | ICD-10-CM | POA: Diagnosis not present

## 2022-07-24 DIAGNOSIS — J4520 Mild intermittent asthma, uncomplicated: Secondary | ICD-10-CM

## 2022-07-24 DIAGNOSIS — Z1159 Encounter for screening for other viral diseases: Secondary | ICD-10-CM

## 2022-07-24 LAB — CBC WITH DIFFERENTIAL/PLATELET
Basophils Absolute: 0 10*3/uL (ref 0.0–0.1)
Basophils Relative: 0.6 % (ref 0.0–3.0)
Eosinophils Absolute: 0 10*3/uL (ref 0.0–0.7)
Eosinophils Relative: 0.8 % (ref 0.0–5.0)
HCT: 37.6 % (ref 36.0–46.0)
Hemoglobin: 13.1 g/dL (ref 12.0–15.0)
Lymphocytes Relative: 26.8 % (ref 12.0–46.0)
Lymphs Abs: 1.6 10*3/uL (ref 0.7–4.0)
MCHC: 35 g/dL (ref 30.0–36.0)
MCV: 94.6 fl (ref 78.0–100.0)
Monocytes Absolute: 0.3 10*3/uL (ref 0.1–1.0)
Monocytes Relative: 5.9 % (ref 3.0–12.0)
Neutro Abs: 3.8 10*3/uL (ref 1.4–7.7)
Neutrophils Relative %: 65.9 % (ref 43.0–77.0)
Platelets: 174 10*3/uL (ref 150.0–400.0)
RBC: 3.97 Mil/uL (ref 3.87–5.11)
RDW: 12.7 % (ref 11.5–15.5)
WBC: 5.8 10*3/uL (ref 4.0–10.5)

## 2022-07-24 MED ORDER — ALBUTEROL SULFATE (2.5 MG/3ML) 0.083% IN NEBU
2.5000 mg | INHALATION_SOLUTION | Freq: Four times a day (QID) | RESPIRATORY_TRACT | 1 refills | Status: DC | PRN
  Filled 2022-07-24 – 2022-08-20 (×2): qty 150, 13d supply, fill #0

## 2022-07-24 NOTE — Patient Instructions (Signed)
Follow up in 1 year or as needed We'll notify you of your lab results and make any changes if needed Keep up the good work!  You look great!!! Call with any questions or concerns Stay Safe!  Stay Healthy!! Happy Fall!!! GOOD LUCK TRAINING!

## 2022-07-24 NOTE — Assessment & Plan Note (Signed)
Pt's PE WNL.  UTD on pap, Tdap, flu.  Check labs.  Anticipatory guidance provided.

## 2022-07-24 NOTE — Progress Notes (Signed)
   Subjective:    Patient ID: Paula Guzman, female    DOB: 30-Sep-1986, 36 y.o.   MRN: [id number]  HPI CPE- UTD on pap, Tdap, flu shot earlier today.  Patient Care Team    Relationship Specialty Notifications Start End  Midge Minium, MD PCP - General Family Medicine  06/02/16   Jerelyn Charles, MD Consulting Physician Obstetrics  05/28/16   Macarthur Critchley, [place] Referring Physician Optometry  05/28/16     Health Maintenance  Topic Date Due   Hepatitis C Screening  Never done   PAP SMEAR-Modifier  08/27/2021   TETANUS/TDAP  05/01/2027   INFLUENZA VACCINE  Completed   HPV VACCINES  Aged Out   COVID-19 Vaccine  Discontinued   HIV Screening  Discontinued      Review of Systems Patient reports no vision/ hearing changes, adenopathy,fever, weight change,  persistant/recurrent hoarseness , swallowing issues, chest pain, palpitations, edema, persistant/recurrent cough, hemoptysis, dyspnea (rest/exertional/paroxysmal nocturnal), gastrointestinal bleeding (melena, rectal bleeding), abdominal pain, significant heartburn, bowel changes, GU symptoms (dysuria, hematuria, incontinence), Gyn symptoms (abnormal  bleeding, pain),  syncope, focal weakness, memory loss, numbness & tingling, skin/hair/nail changes, abnormal bruising or bleeding, anxiety, or depression.     Objective:   Physical Exam General Appearance:    Alert, cooperative, no distress, appears stated age  Head:    Normocephalic, without obvious abnormality, atraumatic  Eyes:    PERRL, conjunctiva/corneas clear, EOM's intact both eyes  Ears:    Normal TM's and external ear canals, both ears  Nose:   Nares normal, septum midline, mucosa normal, no drainage    or sinus tenderness  Throat:   Lips, mucosa, and tongue normal; teeth and gums normal  Neck:   Supple, symmetrical, trachea midline, no adenopathy;    Thyroid: no enlargement/tenderness/nodules  Back:     Symmetric, no curvature, ROM normal, no CVA tenderness  Lungs:     Clear  to auscultation bilaterally, respirations unlabored  Chest Wall:    No tenderness or deformity   Heart:    Regular rate and rhythm, S1 and S2 normal, no murmur, rub   or gallop  Breast Exam:    Deferred to GYN  Abdomen:     Soft, non-tender, bowel sounds active all four quadrants,    no masses, no organomegaly  Genitalia:    Deferred to GYN  Rectal:    Extremities:   Extremities normal, atraumatic, no cyanosis or edema  Pulses:   2+ and symmetric all extremities  Skin:   Skin color, texture, turgor normal, no rashes or lesions  Lymph nodes:   Cervical, supraclavicular, and axillary nodes normal  Neurologic:   CNII-XII intact, normal strength, sensation and reflexes    throughout          Assessment & Plan:

## 2022-07-25 LAB — COMPREHENSIVE METABOLIC PANEL
ALT: 12 U/L (ref 0–35)
AST: 14 U/L (ref 0–37)
Albumin: 4.6 g/dL (ref 3.5–5.2)
Alkaline Phosphatase: 53 U/L (ref 39–117)
BUN: 10 mg/dL (ref 6–23)
CO2: 29 mEq/L (ref 19–32)
Calcium: 9.4 mg/dL (ref 8.4–10.5)
Chloride: 105 mEq/L (ref 96–112)
Creatinine, Ser: 0.61 mg/dL (ref 0.40–1.20)
GFR: 116.88 mL/min (ref >60.00)
Glucose, Bld: 93 mg/dL (ref 70–99)
Potassium: 4 mEq/L (ref 3.5–5.1)
Sodium: 141 mEq/L (ref 135–145)
Total Bilirubin: 0.9 mg/dL (ref 0.2–1.2)
Total Protein: 7.1 g/dL (ref 6.0–8.3)

## 2022-07-25 LAB — HEPATITIS C ANTIBODY: Hepatitis C Ab: NONREACTIVE

## 2022-07-25 LAB — TSH: TSH: 1.41 u[IU]/mL (ref 0.35–5.50)

## 2022-07-25 NOTE — Progress Notes (Signed)
Informed pt of lab results  

## 2022-08-01 ENCOUNTER — Other Ambulatory Visit (HOSPITAL_COMMUNITY): Payer: Self-pay

## 2022-08-15 ENCOUNTER — Other Ambulatory Visit: Payer: No Typology Code available for payment source

## 2022-08-20 ENCOUNTER — Ambulatory Visit (INDEPENDENT_AMBULATORY_CARE_PROVIDER_SITE_OTHER): Payer: No Typology Code available for payment source | Admitting: Podiatry

## 2022-08-20 ENCOUNTER — Other Ambulatory Visit: Payer: Self-pay

## 2022-08-20 ENCOUNTER — Other Ambulatory Visit (HOSPITAL_COMMUNITY): Payer: Self-pay

## 2022-08-20 DIAGNOSIS — B351 Tinea unguium: Secondary | ICD-10-CM

## 2022-08-20 MED ORDER — ALBUTEROL SULFATE HFA 108 (90 BASE) MCG/ACT IN AERS
2.0000 | INHALATION_SPRAY | Freq: Four times a day (QID) | RESPIRATORY_TRACT | 2 refills | Status: AC | PRN
  Filled 2022-08-20: qty 6.7, 25d supply, fill #0

## 2022-08-20 NOTE — Progress Notes (Signed)
Patient presents today for the 4th laser treatment. Diagnosed with mycotic nail infection by Dr. Paulla Dolly.    Toenail most affected hallux nails bilateral.   All other systems are negative.   Nails were filed thin. Laser therapy was administered to 1-5 toenails bilateral and patient tolerated the treatment well. All safety precautions were in place.   Patient has completed course of Lamisil. Patient will like to try topical medication for nail fungus while she is completing the laser treatments.    Single pass over non-affected nails.  Follow up in 6 weeks for laser # 5.

## 2022-08-21 ENCOUNTER — Other Ambulatory Visit: Payer: Self-pay | Admitting: Podiatry

## 2022-08-21 ENCOUNTER — Other Ambulatory Visit (HOSPITAL_COMMUNITY): Payer: Self-pay

## 2022-08-21 MED ORDER — CICLOPIROX 8 % EX SOLN
Freq: Every day | CUTANEOUS | 0 refills | Status: AC
  Filled 2022-08-21: qty 6.6, 30d supply, fill #0

## 2022-09-30 ENCOUNTER — Ambulatory Visit: Payer: No Typology Code available for payment source | Admitting: *Deleted

## 2022-10-02 ENCOUNTER — Other Ambulatory Visit: Payer: No Typology Code available for payment source

## 2022-10-23 ENCOUNTER — Telehealth: Payer: Self-pay

## 2022-10-23 DIAGNOSIS — Z3201 Encounter for pregnancy test, result positive: Secondary | ICD-10-CM

## 2022-10-23 NOTE — Telephone Encounter (Signed)
Patient called reporting positive UPT. Outpatient HCG ordered and will order viability scan based on results.   Wende Mott, CNM 10/23/22 3:13 PM

## 2022-10-24 ENCOUNTER — Other Ambulatory Visit: Payer: Self-pay | Admitting: *Deleted

## 2022-10-24 DIAGNOSIS — O021 Missed abortion: Secondary | ICD-10-CM

## 2022-10-24 LAB — CBC
Hemoglobin: 12.8 g/dL (ref 11.7–15.5)
MCHC: 34.4 g/dL (ref 32.0–36.0)
Platelets: 181 10*3/uL (ref 140–400)
RBC: 3.94 10*6/uL (ref 3.80–5.10)
WBC: 4.8 10*3/uL (ref 3.8–10.8)

## 2022-10-25 ENCOUNTER — Inpatient Hospital Stay (HOSPITAL_COMMUNITY): Payer: 59

## 2022-10-25 ENCOUNTER — Inpatient Hospital Stay (HOSPITAL_COMMUNITY)
Admission: AD | Admit: 2022-10-25 | Discharge: 2022-10-25 | Disposition: A | Discharge status: Home / Self Care | Payer: 59 | Attending: Obstetrics and Gynecology | Admitting: Obstetrics and Gynecology

## 2022-10-25 ENCOUNTER — Other Ambulatory Visit: Payer: Self-pay

## 2022-10-25 ENCOUNTER — Encounter (HOSPITAL_COMMUNITY): Payer: Self-pay | Admitting: Obstetrics and Gynecology

## 2022-10-25 DIAGNOSIS — Z3A01 Less than 8 weeks gestation of pregnancy: Secondary | ICD-10-CM

## 2022-10-25 DIAGNOSIS — O021 Missed abortion: Secondary | ICD-10-CM | POA: Insufficient documentation

## 2022-10-25 DIAGNOSIS — Z3A10 10 weeks gestation of pregnancy: Secondary | ICD-10-CM | POA: Diagnosis not present

## 2022-10-25 DIAGNOSIS — O209 Hemorrhage in early pregnancy, unspecified: Secondary | ICD-10-CM | POA: Diagnosis not present

## 2022-10-25 LAB — CBC
HCT: 37.2 % (ref 35.0–45.0)
MCH: 32.5 pg (ref 27.0–33.0)
MCV: 94.4 fL (ref 80.0–100.0)
MPV: 11.1 fL (ref 7.5–12.5)
RDW: 11.2 % (ref 11.0–15.0)

## 2022-10-25 LAB — HEMOGLOBIN AND HEMATOCRIT, BLOOD
HCT: 39.5 % (ref 36.0–46.0)
Hemoglobin: 13.9 g/dL (ref 12.0–15.0)

## 2022-10-25 LAB — ABO AND RH

## 2022-10-25 LAB — HCG, QUANTITATIVE, PREGNANCY
HCG, Total, QN: 25752 m[IU]/mL
hCG, Beta Chain, Quant, S: 31216 m[IU]/mL — ABNORMAL HIGH (ref <5)

## 2022-10-25 MED ORDER — LACTATED RINGERS IV BOLUS
1000.0000 mL | Freq: Once | INTRAVENOUS | Status: AC
  Administered 2022-10-25: 1000 mL via INTRAVENOUS

## 2022-10-25 MED ORDER — DOXYCYCLINE HYCLATE 100 MG PO CAPS: 200.0000 mg | ORAL_CAPSULE | Freq: Once | ORAL | 0 refills | Status: AC

## 2022-10-25 MED ORDER — KETOROLAC TROMETHAMINE 30 MG/ML IJ SOLN
30.0000 mg | Freq: Once | INTRAMUSCULAR | Status: AC
  Administered 2022-10-25: 30 mg via INTRAVENOUS

## 2022-10-25 MED ORDER — DOXYCYCLINE HYCLATE 100 MG PO CAPS: 200.0000 mg | ORAL_CAPSULE | Freq: Once | ORAL | 0 refills | Status: DC

## 2022-10-25 MED ORDER — FENTANYL CITRATE (PF) 100 MCG/2ML IJ SOLN
25.0000 ug | Freq: Once | INTRAMUSCULAR | Status: AC
  Administered 2022-10-25: 25 ug via INTRAVENOUS
  Filled 2022-10-25: qty 2

## 2022-10-25 MED ORDER — PROMETHAZINE HCL 25 MG PO TABS: 25.0000 mg | ORAL_TABLET | Freq: Four times a day (QID) | ORAL | 1 refills | Status: AC | PRN

## 2022-10-25 MED ORDER — LIDOCAINE HCL (PF) 1 % IJ SOLN
10.0000 mL | Freq: Once | INTRAMUSCULAR | Status: AC
  Administered 2022-10-25: 10 mL
  Filled 2022-10-25: qty 10

## 2022-10-25 MED ORDER — ONDANSETRON 8 MG PO TBDP: 8.0000 mg | ORAL_TABLET | Freq: Three times a day (TID) | ORAL | 1 refills | Status: AC | PRN

## 2022-10-25 MED ORDER — ONDANSETRON HCL 4 MG/2ML IJ SOLN
4.0000 mg | Freq: Once | INTRAMUSCULAR | Status: AC
  Administered 2022-10-25: 4 mg via INTRAVENOUS
  Filled 2022-10-25: qty 2

## 2022-10-25 MED ORDER — MEPIVACAINE HCL (PF) 1 % IJ SOLN: 10.0000 mL | Freq: Once | INTRAMUSCULAR | Status: DC

## 2022-10-25 MED ORDER — OXYCODONE HCL 5 MG PO TABS: 5.0000 mg | ORAL_TABLET | ORAL | 0 refills | Status: AC | PRN

## 2022-10-25 MED ORDER — KETOROLAC TROMETHAMINE 30 MG/ML IJ SOLN
30.0000 mg | Freq: Once | INTRAMUSCULAR | Status: DC
  Filled 2022-10-25: qty 1

## 2022-10-25 MED ORDER — FENTANYL CITRATE (PF) 100 MCG/2ML IJ SOLN
25.0000 ug | Freq: Once | INTRAMUSCULAR | Status: AC
  Administered 2022-10-25: 25 ug via INTRAVENOUS

## 2022-10-25 NOTE — MAU Note (Signed)
Paula Guzman is a 37 y.o. at Unknown here in MAU reporting: cramping and bleeding that started 2 nights ago. Red spotting when she wipes. Rates her pain 1 out of 10.   LMP 08/11/22  Onset of complaint: 10/23/22 Pain score: 1 Vitals:   10/25/22 1534  BP: 123/70  Pulse: (!) 59  Resp: 17  Temp: 98.3 F (36.8 C)
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
## 2022-10-25 NOTE — MAU Note (Addendum)
[facility] kit sent # [id number]

## 2022-10-31 ENCOUNTER — Ambulatory Visit (INDEPENDENT_AMBULATORY_CARE_PROVIDER_SITE_OTHER): Payer: 59

## 2022-10-31 VITALS — BP 123/71 | HR 72 | Ht 66.0 in | Wt 134.0 lb

## 2022-10-31 DIAGNOSIS — Z3A01 Less than 8 weeks gestation of pregnancy: Secondary | ICD-10-CM

## 2022-10-31 DIAGNOSIS — O039 Complete or unspecified spontaneous abortion without complication: Secondary | ICD-10-CM | POA: Diagnosis not present
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
## 2022-11-01 LAB — BETA HCG QUANT (REF LAB): hCG Quant: 898 m[IU]/mL

## 2022-11-04 ENCOUNTER — Encounter: Payer: 59 | Admitting: Obstetrics and Gynecology

## 2022-11-07 ENCOUNTER — Other Ambulatory Visit: Payer: 59 | Admitting: *Deleted

## 2022-11-07 DIAGNOSIS — O039 Complete or unspecified spontaneous abortion without complication: Secondary | ICD-10-CM

## 2022-11-08 LAB — BETA HCG QUANT (REF LAB): hCG Quant: 84 m[IU]/mL

## 2022-11-11 ENCOUNTER — Other Ambulatory Visit: Payer: Self-pay | Admitting: *Deleted

## 2022-11-11 DIAGNOSIS — O039 Complete or unspecified spontaneous abortion without complication: Secondary | ICD-10-CM

## 2022-11-14 ENCOUNTER — Other Ambulatory Visit: Payer: 59

## 2022-11-20 ENCOUNTER — Encounter: Payer: Self-pay | Admitting: General Practice

## 2022-11-25 LAB — ANORA MISCARRIAGE TEST - FRESH

## 2022-11-27 DIAGNOSIS — H5213 Myopia, bilateral: Secondary | ICD-10-CM | POA: Diagnosis not present

## 2023-07-30 ENCOUNTER — Encounter: Payer: 59 | Admitting: Family Medicine

## 2024-04-29 ENCOUNTER — Encounter: Payer: Self-pay | Admitting: Advanced Practice Midwife
